# Patient Record
Sex: Female | Born: 1955 | Race: White | Hispanic: No | Marital: Married | Smoking: Never smoker
Health system: Southern US, Community
[De-identification: ages and names within clinical notes are randomized; demographics above are authoritative.]

## PROBLEM LIST (undated history)

## (undated) DIAGNOSIS — F329 Major depressive disorder, single episode, unspecified: Secondary | ICD-10-CM

## (undated) DIAGNOSIS — Z803 Family history of malignant neoplasm of breast: Secondary | ICD-10-CM

## (undated) DIAGNOSIS — Z8042 Family history of malignant neoplasm of prostate: Secondary | ICD-10-CM

## (undated) DIAGNOSIS — Z8051 Family history of malignant neoplasm of kidney: Secondary | ICD-10-CM

## (undated) DIAGNOSIS — F32A Depression, unspecified: Secondary | ICD-10-CM

## (undated) DIAGNOSIS — Z808 Family history of malignant neoplasm of other organs or systems: Secondary | ICD-10-CM

## (undated) HISTORY — DX: Family history of malignant neoplasm of other organs or systems: Z80.8

## (undated) HISTORY — DX: Family history of malignant neoplasm of breast: Z80.3

## (undated) HISTORY — PX: BLADDER SUSPENSION: SHX72

## (undated) HISTORY — PX: SHOULDER SURGERY: SHX246

## (undated) HISTORY — PX: TONSILLECTOMY: SUR1361

## (undated) HISTORY — PX: TUBAL LIGATION: SHX77

## (undated) HISTORY — PX: ABDOMINAL HYSTERECTOMY: SHX81

## (undated) HISTORY — DX: Family history of malignant neoplasm of prostate: Z80.42

## (undated) HISTORY — DX: Family history of malignant neoplasm of kidney: Z80.51

## (undated) HISTORY — DX: Depression, unspecified: F32.A

## (undated) HISTORY — DX: Major depressive disorder, single episode, unspecified: F32.9

---

## 1999-11-17 ENCOUNTER — Other Ambulatory Visit: Admission: RE | Admit: 1999-11-17 | Discharge: 1999-11-17 | Payer: Self-pay | Admitting: Obstetrics and Gynecology

## 2000-12-21 ENCOUNTER — Other Ambulatory Visit: Admission: RE | Admit: 2000-12-21 | Discharge: 2000-12-21 | Payer: Self-pay | Admitting: Obstetrics and Gynecology

## 2001-08-30 ENCOUNTER — Encounter (INDEPENDENT_AMBULATORY_CARE_PROVIDER_SITE_OTHER): Payer: Self-pay

## 2001-08-30 ENCOUNTER — Observation Stay (HOSPITAL_COMMUNITY): Admission: RE | Admit: 2001-08-30 | Discharge: 2001-08-31 | Payer: Self-pay | Admitting: Obstetrics and Gynecology

## 2007-07-13 ENCOUNTER — Encounter: Admission: RE | Admit: 2007-07-13 | Discharge: 2007-07-13 | Payer: Self-pay | Admitting: Family Medicine

## 2007-11-30 ENCOUNTER — Ambulatory Visit (HOSPITAL_BASED_OUTPATIENT_CLINIC_OR_DEPARTMENT_OTHER): Admission: RE | Admit: 2007-11-30 | Discharge: 2007-11-30 | Payer: Self-pay | Admitting: Urology

## 2010-11-09 NOTE — Op Note (Signed)
Judith Roth, Judith Roth             ACCOUNT NO.:  [id number]   MEDICAL RECORD NO.:  [id number]          PATIENT TYPE:  AMB   LOCATION:  [facility]                         FACILITY:  [facility]   PHYSICIAN:  Jamison Neighbor, M.D.  DATE OF BIRTH:  [birth year]   DATE OF PROCEDURE:  11/30/2007  DATE OF DISCHARGE:                               OPERATIVE REPORT   PREOPERATIVE DIAGNOSES:  1. Stress urinary incontinence.  2. Labial cyst.   PROCEDURE PERFORMED:  Drainage of labial cyst, cystoscopy, mid urethral  sling placement.   SURGEON:  Jamison Neighbor, M.D.   ASSISTANT:  Dr. Melina Schools.   ANESTHESIA:  General.   INDICATIONS FOR PROCEDURE:  Ms. Yaklin is a 55 year old female with  stress urinary incontinence.  She was evaluated with urodynamics and  found to be a candidate for mid urethral sling placed retropubically and  transvaginally.  She also had a small labial cyst which required  drainage.   DESCRIPTION OF PROCEDURE:  The patient was brought to the operating  room.  She was identified by her arm band.  Informed consent was  verified and preoperative time-out was performed.  After successful  induction of general anesthesia the patient was moved to the dorsal  lithotomy position.  The operative site was shaved, prepped and draped  in the usual fashion.  Perioperative antibiotics were administered.  Weighted speculum was placed within the vagina.  We saw a 1 cm diameter  cyst which was drained.  We then placed a Foley catheter and drained the  bladder.  Allis clamp was placed on the anterior vaginal wall and  lidocaine with epinephrine was injected submucosally for  hydrodissection.  We then made a vertical incision in the anterior  vaginal wall.  We dissected out laterally using shoelace scissors  ensuring an adequate thickness of vaginal flap.  Then dissection was  carried out toward the urethrovesical angle.  We could palpate the space  retropubically with a finger.  We then made  two stab incisions along the  pubis.  We then passed the curved needles along the posterior aspect of  the pubic symphysis into our vaginal incision.  We then performed  cystoscopy with 12 and 70 degree lenses and identified no bladder injury  and efflux of urine from the ureteral orifices.  We then passed the  sling.  It was tensioned with a right angle.  We did see adequate iris  effect on the urethra and also prompt straight flow of urine with Crede  maneuver.  We then irrigated.  The vaginal incision was closed with  running 2-0 Vicryl.  The suprapubic stab incisions were closed with  Dermabond.  The vagina was packed and the Foley catheter was plugged.  At this time the procedure was  terminated.  The patient tolerated the procedure well and there were no  complications.  Dr. Marcelyn Bruins was the attending primary care  physician and present and participated in all aspects of the procedure.   DISPOSITION:  Post anesthesia care unit.     ______________________________  Melina Schools, Dr      Molly Maduro  Danne [place], M.D.  Electronically Signed    JR/MEDQ  D:  11/30/2007  T:  11/30/2007  Job:  [id number]

## 2010-11-12 NOTE — Op Note (Signed)
[facility]  Patient:    Judith Roth, Judith Roth Visit Number: [id number] MRN: [id number]          Service Type: DSU Location: 9300 [id number] Attending Physician:  Lenoard Aden Dictated by:   Lenoard Aden, M.D. Proc. Date: 08/30/01 Admit Date:  08/30/2001                             Operative Report  PREOPERATIVE DIAGNOSES:       Menometrorrhagia with pelvic pain and symptomatic uterine fibroids.  POSTOPERATIVE DIAGNOSES:      Menometrorrhagia with pelvic pain and symptomatic uterine fibroids, enterocele.  PROCEDURE:                    Laparoscopically assisted vaginal hysterectomy, bilateral salpingo-oophorectomy, uterine morcellation, McCall culdoplasty.  SURGEON:                      Lenoard Aden, M.D.  ASSISTANT:                    Sung Amabile. Roslyn Smiling, M.D.  ESTIMATED BLOOD LOSS:         200 cc.  COMPLICATIONS:                None.  DRAINS:                       Foley and vaginal pack.  DISPOSITION:                  Patient to recovery room in good condition.  COUNTS:                       Correct.  Pictures taken.  OPERATIVE NOTE:               After being apprised of the risks of anesthesia, infection, bleeding, injury to abdominal organs, need for repair patient is brought to the operating room where she is administered general anesthesia without complications.  Prepped and draped in usual sterile fashion.  Foley catheter placed.  Infraumbilical made with a scalpel.  Hulka tenaculum placed per vagina.  Dilute Marcaine solution placed in the abdominal solution.  Hulka tenaculum placed prior to abdominal incision.  At this time Veress needle placed.  Opening pressure -2 noted.  Patient pressure set at 25.  CO2 5 L insufflated without difficulty.  Veress needle placed atraumatically.  Hanging drop test is consistent with intraperitoneal entry.  After the 5 L of CO2 had been insufflated without difficulty the Veress needle was  removed, then 10 mm trocar entered.  Visualization reveals atraumatic trocar entry, normal liver/gallbladder bed, area of right mid quadrant adhesions to the right lateral side wall.  Appendix not visualized.  Normal right and left ovary. Previously surgically divided tubes and bulky uterus with multiple fibroids. At this time the cul-de-sac anterior and posterior appear clear.  Both ureters are visualized easily and 5 mm trocar sites are placed bilaterally in the mid clavicular line midway between the suprapubic area and the umbilicus.  At this time tripolar is used to divide the infundibulopelvic ligament after identifying the ureter and this is done taking progressive bites down to the round ligament and then down to the level of the uterine vessels achieving good hemostasis in the process.  Bladder flap is developed sharply after identifying the same landmarks  on the right.  It is noted that there is bleeding from the left lower quadrant trocar site.  These trocar is then removed.  Prolene 0 suture is placed and cautery is used to cauterize inside the incision internally.  Good hemostasis achieved.  Sutures left.  Suprapubic incision is then made and 5 mm trocar placed ______.  Right pedicles are taken using the tripolar cautery, infundibulopelvic ligament after identification of the ureter and then down to the round ligament and to the level of the uterine vessels after developing the bladder flap sharply.  At this time attention is turned to the vaginal portion of the procedure whereby a weighted speculum is placed.  Dilute vasopressin solution infiltrated at the cervicovaginal junction and this junction is scored using electrocautery.  The posterior cul-de-sac entry is made sharply and atraumatically.  A long weighted retractor is placed.  Uterosacral ligaments are bilaterally clamped, suture ligated, transfixed to the vaginal cuff, and held.  LigaSure is used to secure the uterine  vessels bilaterally and anterior cul-de-sac entry is made. Progressive bites along the lateral cardinal ligament complexes and broad ligament complexes are made using the LigaSure dividing these pedicles after appropriate hemostasis is noted.  Specimen is then morcellated with amputation of the cervix and morcellation of the uterus to the liver in two specimens, the uterus, tubes, ovaries, and cervix.  Enterocele is then identified and closed using a 0 Vicryl for internal McCall culdoplasty suture.  Vaginal cuff is then closed side to side from anterior to posterior using 0 Vicryl popoffs. Packing is placed.  Clear urine is noted.  Attention is turned to the abdominal portion of the procedure whereby all pedicles appear hemostatic. Irrigation is accomplished.  Patient pressure is released and found to be hemostatic.  Prolene 0 suture removed from left lower quadrant site and after releasing the pneumoperitoneum no bleeding is noted from any of the operative pedicles nor from this umbilical site.  Dermabond is placed on the three subumbilical skin areas with 0 Vicryl and 4-0 Vicryl placed in the umbilicus. Patient is awakened and transferred to recovery in good condition. Dictated by:   Lenoard Aden, M.D. Attending Physician:  Lenoard Aden DD:  08/30/01 TD:  08/31/01 Job: [id number]

## 2010-11-12 NOTE — H&P (Signed)
[facility]  Patient:    Judith Roth, Judith Roth Visit Number: [id number] MRN: [id number]          Service Type: DSU Location: 9300 [id number] Attending Physician:  Lenoard Aden Dictated by:   Lenoard Aden, M.D. Admit Date:  08/30/2001                           History and Physical  CHIEF COMPLAINT:              Menometrorrhagia with symptomatic uterine fibroids.  HISTORY OF PRESENT ILLNESS:   A 55 year old white female G2, P2 with a history of secondary anemia, dysmenorrhea, menorrhagia status post normal endometrial biopsy January 2003 for definitive therapy.  MEDICATIONS:                  Prozac.  PAST MEDICAL HISTORY:         Hypertension.  FAMILY HISTORY:               Hypertension, breast cancer, kidney and prostate cancer, diabetes, heart disease.  PAST SURGICAL HISTORY:        Tonsillectomy and eye surgery.  SOCIAL HISTORY:               She is status post divorce and currently in monogamous relationship.  PAST GYNECOLOGIC HISTORY:     Two previous vaginal deliveries.  PHYSICAL EXAMINATION  GENERAL:                      She is a well-developed, well-nourished white female in no apparent distress.  HEENT:                        normal.  LUNGS:                        Clear.  HEART:                        Regular rate and rhythm.  ABDOMEN:                      Soft, scaphoid, and nontender.  PELVIC:                       Enlarged anteflexed uterus, bulky.  No adnexal masses appreciated.  Grade II cystocele and no rectocele.  IMPRESSION:                   1. Symptomatic pelvic relaxation with no                                  evidence of stress urinary incontinence                                  status post urology consultation.  Dr.                                  Wanda Plump recommends no placement of  pubovaginal sling at this time.                               2. Menometrorrhagia  with inability to control                                  hormonally.  PLAN:                         Proceed with definitive therapy in the form of LAVH, BSO, anterior colporrhaphy, possible TAH/BSO.  Risks of anesthesia, infection, bleeding, injury to abdominal organs with need for repair is discussed.  Delayed versus immediate complications to include bowel and bladder injury are noted.  Patient acknowledges and desires to proceed. Dictated by:   Lenoard Aden, M.D. Attending Physician:  Lenoard Aden DD:  08/30/01 TD:  08/30/01 Job: [id number]

## 2010-11-12 NOTE — Discharge Summary (Signed)
[facility]  Patient:    Judith Roth, Judith Roth Visit Number: [id number] MRN: [id number]          Service Type: DSU Location: 9300 [id number] Attending Physician:  Lenoard Aden Dictated by:   Lenoard Aden, M.D. Admit Date:  08/30/2001 Discharge Date: 08/31/2001                             Discharge Summary  HOSPITAL COURSE:              The patient was underwent uncomplicated LAVH-BSO, McCall culdoplasty August 30, 2001.  Tolerated regular diet well. Postoperative hemoglobin 9.4.  Discharged to home day 1.  Tylox given. Follow-up at the office one week. Dictated by:   Lenoard Aden, M.D. Attending Physician:  Lenoard Aden DD:  09/14/01 TD:  09/17/01 Job: [id number]

## 2011-02-02 ENCOUNTER — Other Ambulatory Visit: Payer: Self-pay | Admitting: Dermatology

## 2011-03-24 LAB — POCT HEMOGLOBIN-HEMACUE
Hemoglobin: 14.3
Operator id: 133231

## 2012-02-01 ENCOUNTER — Other Ambulatory Visit: Payer: Self-pay | Admitting: Dermatology

## 2012-02-28 ENCOUNTER — Ambulatory Visit (INDEPENDENT_AMBULATORY_CARE_PROVIDER_SITE_OTHER): Payer: BC Managed Care – PPO | Admitting: Family Medicine

## 2012-02-28 VITALS — BP 94/60 | HR 63 | Temp 97.9°F | Resp 14 | Ht 66.0 in | Wt 172.0 lb

## 2012-02-28 DIAGNOSIS — N390 Urinary tract infection, site not specified: Secondary | ICD-10-CM

## 2012-02-28 DIAGNOSIS — R350 Frequency of micturition: Secondary | ICD-10-CM

## 2012-02-28 DIAGNOSIS — R319 Hematuria, unspecified: Secondary | ICD-10-CM

## 2012-02-28 LAB — POCT URINALYSIS DIPSTICK
Bilirubin, UA: NEGATIVE
Glucose, UA: NEGATIVE
Ketones, UA: NEGATIVE
Nitrite, UA: POSITIVE
Spec Grav, UA: 1.01
Urobilinogen, UA: 0.2
pH, UA: 7

## 2012-02-28 LAB — POCT UA - MICROSCOPIC ONLY
Casts, Ur, LPF, POC: NEGATIVE
Crystals, Ur, HPF, POC: NEGATIVE
Yeast, UA: NEGATIVE

## 2012-02-28 MED ORDER — CIPROFLOXACIN HCL 500 MG PO TABS: 500.0000 mg | ORAL_TABLET | Freq: Two times a day (BID) | ORAL | Status: AC

## 2012-02-28 NOTE — Patient Instructions (Addendum)
1. Frequency of urination  POCT urinalysis dipstick, POCT UA - Microscopic Only, ciprofloxacin (CIPRO) 500 MG tablet  2. UTI (lower urinary tract infection)  ciprofloxacin (CIPRO) 500 MG tablet   Urinary Tract Infection Infections of the urinary tract can start in several places. A bladder infection (cystitis), a kidney infection (pyelonephritis), and a prostate infection (prostatitis) are different types of urinary tract infections (UTIs). They usually get better if treated with medicines (antibiotics) that kill germs. Take all the medicine until it is gone. You or your child may feel better in a few days, but TAKE ALL MEDICINE or the infection may not respond and may become more difficult to treat. HOME CARE INSTRUCTIONS   Drink enough water and fluids to keep the urine clear or pale yellow. Cranberry juice is especially recommended, in addition to large amounts of water.   Avoid caffeine, tea, and carbonated beverages. They tend to irritate the bladder.   Alcohol may irritate the prostate.   Only take over-the-counter or prescription medicines for pain, discomfort, or fever as directed by your caregiver.  To prevent further infections:  Empty the bladder often. Avoid holding urine for long periods of time.   After a bowel movement, women should cleanse from front to back. Use each tissue only once.   Empty the bladder before and after sexual intercourse.  FINDING OUT THE RESULTS OF YOUR TEST Not all test results are available during your visit. If your or your child's test results are not back during the visit, make an appointment with your caregiver to find out the results. Do not assume everything is normal if you have not heard from your caregiver or the medical facility. It is important for you to follow up on all test results. SEEK MEDICAL CARE IF:   There is back pain.   Your baby is older than 3 months with a rectal temperature of 100.5 F (38.1 C) or higher for more than 1 day.    Your or your child's problems (symptoms) are no better in 3 days. Return sooner if you or your child is getting worse.  SEEK IMMEDIATE MEDICAL CARE IF:   There is severe back pain or lower abdominal pain.   You or your child develops chills.   You have a fever.   Your baby is older than 3 months with a rectal temperature of 102 F (38.9 C) or higher.   Your baby is 69 months old or younger with a rectal temperature of 100.4 F (38 C) or higher.   There is nausea or vomiting.   There is continued burning or discomfort with urination.  MAKE SURE YOU:   Understand these instructions.   Will watch your condition.   Will get help right away if you are not doing well or get worse.  Document Released: 03/23/2005 Document Revised: 06/02/2011 Document Reviewed: 10/26/2006 [facility] Patient Information 2012 [place], [place].

## 2012-02-28 NOTE — Progress Notes (Signed)
Subjective:    Patient ID: Judith Roth, female    DOB: 01-May-1956, 56 y.o.   MRN: [id number]  HPIThis 56 y.o. female presents for evaluation of bladder infection.  Onset one day ago.  +burning with urination; +frequency.  Trying to drink increased amount of water.  +diarrhea with UTI usually; +HA.  No fever but +chills/sweats.  +nausea; no vomiting.  No hematuria.  Taking AZO.  No flank pain.  Nocturia x 1; nocturia x 0.  No vaginal discharge.  No new sexual partners in past six months.  Suffers with 3 UTIs per year.    PCP:  [facility] PMH: 1. Depression Psurg: 1. Hysterectomy DUB/fibroids  2.  Tonsillectomy 3.  Shoulder Surgery  4. BTL  5.  Bladder Sling by Logan Bores 2008 Medications:  1.  Prozac  2. Calcium, Iron, MVI Social: married.   Review of Systems  Constitutional: Positive for chills. Negative for fever, diaphoresis and fatigue.  Gastrointestinal: Positive for nausea and diarrhea. Negative for vomiting and abdominal pain.  Genitourinary: Positive for dysuria, urgency and frequency. Negative for hematuria, flank pain, vaginal discharge, genital sores, vaginal pain and pelvic pain.        Past Medical History  Diagnosis Date  . Depression     Past Surgical History  Procedure Date  . Abdominal hysterectomy     DUB/fibroids  . Tubal ligation   . Bladder suspension     [place]  . Shoulder surgery   . Tonsillectomy     Prior to Admission medications   Medication Sig Start Date End Date Taking? Authorizing Provider  calcium carbonate 200 MG capsule Take 250 mg by mouth 2 (two) times daily with a meal.   Yes Historical Provider, MD  Estradiol Acetate (FEMRING) 0.1 MG/24HR RING Place 1 each vaginally.   Yes Historical Provider, MD  FLUoxetine (PROZAC) 20 MG capsule Take 20 mg by mouth 2 (two) times daily.   Yes Historical Provider, MD  ciprofloxacin (CIPRO) 500 MG tablet Take 1 tablet (500 mg total) by mouth 2 (two) times daily. 02/28/12 03/09/12  Ethelda Chick, MD     No Known Allergies  History   Social History  . Marital Status: Married    Spouse Name: N/A    Number of Children: N/A  . Years of Education: N/A   Occupational History  . Not on file.   Social History Main Topics  . Smoking status: Never Smoker   . Smokeless tobacco: Not on file  . Alcohol Use: Not on file  . Drug Use: Not on file  . Sexually Active: Yes   Other Topics Concern  . Not on file   Social History Narrative  . No narrative on file    No family history on file.  Objective:   Physical Exam  Nursing note and vitals reviewed. Constitutional: She is oriented to person, place, and time. She appears well-developed and well-nourished. No distress.  Eyes: Conjunctivae and EOM are normal. Pupils are equal, round, and reactive to light.  Cardiovascular: Normal rate, regular rhythm and normal heart sounds.   Pulmonary/Chest: Effort normal and breath sounds normal.  Abdominal: Soft. Bowel sounds are normal. She exhibits no distension and no mass. There is tenderness in the suprapubic area. There is no rebound, no guarding and no CVA tenderness.  Neurological: She is alert and oriented to person, place, and time.  Skin: Skin is warm and dry.  Psychiatric: She has a normal mood and affect. Her behavior is normal.  Judgment and thought content normal.       Results for orders placed in visit on 02/28/12  POCT URINALYSIS DIPSTICK      Component Value Range   Color, UA yellow     Clarity, UA cloudy     Glucose, UA neg     Bilirubin, UA neg     Ketones, UA neg     Spec Grav, UA 1.010     Blood, UA mod     pH, UA 7.0     Protein, UA trace     Urobilinogen, UA 0.2     Nitrite, UA positive     Leukocytes, UA large (3+)    POCT UA - MICROSCOPIC ONLY      Component Value Range   WBC, Ur, HPF, POC TNTC     RBC, urine, microscopic 4-8     Bacteria, U Microscopic 1+     Mucus, UA trace     Epithelial cells, urine per micros 2-4     Crystals, Ur, HPF, POC neg      Casts, Ur, LPF, POC neg     Yeast, UA neg        Assessment & Plan:   1. Frequency of urination  POCT urinalysis dipstick, POCT UA - Microscopic Only, ciprofloxacin (CIPRO) 500 MG tablet  2. UTI (lower urinary tract infection)  ciprofloxacin (CIPRO) 500 MG tablet  3.  Hematuria  1.  UTI:  New.  Rx for Cipro provided; continue AZO for next 48 hours.  RTC for fever, vomiting, flank pain.  Obtain urine culture.  Increase fluid intake. 2. Hematuria: New.  Likely secondary to UTI; RTC 2 weeks for repeat u/a.

## 2012-02-28 NOTE — Progress Notes (Signed)
Reviewed and agree.

## 2012-02-29 LAB — URINE CULTURE

## 2013-01-30 ENCOUNTER — Other Ambulatory Visit: Payer: Self-pay | Admitting: Dermatology

## 2013-03-12 ENCOUNTER — Ambulatory Visit (INDEPENDENT_AMBULATORY_CARE_PROVIDER_SITE_OTHER): Payer: PRIVATE HEALTH INSURANCE | Admitting: Physician Assistant

## 2013-03-12 VITALS — BP 114/68 | HR 84 | Temp 98.2°F | Resp 18 | Ht 65.0 in | Wt 182.6 lb

## 2013-03-12 DIAGNOSIS — R350 Frequency of micturition: Secondary | ICD-10-CM

## 2013-03-12 LAB — POCT URINALYSIS DIPSTICK
Bilirubin, UA: NEGATIVE
Glucose, UA: NEGATIVE
Ketones, UA: NEGATIVE
Nitrite, UA: NEGATIVE
Protein, UA: NEGATIVE
Spec Grav, UA: 1.01
Urobilinogen, UA: 0.2
pH, UA: 6

## 2013-03-12 LAB — POCT UA - MICROSCOPIC ONLY
Casts, Ur, LPF, POC: NEGATIVE
Crystals, Ur, HPF, POC: NEGATIVE
Mucus, UA: NEGATIVE
Yeast, UA: NEGATIVE

## 2013-03-12 MED ORDER — CIPROFLOXACIN HCL 500 MG PO TABS: 500.0000 mg | ORAL_TABLET | Freq: Two times a day (BID) | ORAL | Status: DC

## 2013-03-12 NOTE — Patient Instructions (Signed)
Get plenty of rest and drink at least 64 ounces of water daily. 

## 2013-03-12 NOTE — Progress Notes (Signed)
  Subjective:    Patient ID: Judith Roth, female    DOB: 05-03-1956, 57 y.o.   MRN: [id number]  HPI This 57 y.o. female presents for evaluation of urinary urgency, frequency and burning, nausea, pelvic pain and diarrhea.  These are the symptoms she typically experiences with UTI. Had a bladder infection about 3 weeks ago, and was seen by her PCP for treatment.  Symptoms resolved, then recurred several days ago.  Has a history of recurrent bladder infections, which became less frequent after bladder sling placed. Cranberry pills have helped some with the current episode. Monogamous sex with her husband.  No vaginal symptoms.  Medications, allergies, past medical history, surgical history, family history, social history and problem list reviewed.  Review of Systems As above. No fever, chills, no dizziness.    Objective:   Physical Exam  Constitutional: She is oriented to person, place, and time. Vital signs are normal. She appears well-developed and well-nourished. No distress.  HENT:  Head: Normocephalic and atraumatic.  Cardiovascular: Normal rate, regular rhythm and normal heart sounds.   Pulmonary/Chest: Effort normal and breath sounds normal.  Abdominal: Soft. Normal appearance and bowel sounds are normal. She exhibits no distension and no mass. There is no hepatosplenomegaly. There is no tenderness. There is no rigidity, no rebound, no guarding, no CVA tenderness, no tenderness at McBurney's point and negative Murphy's sign. No hernia.  Musculoskeletal: Normal range of motion.       Lumbar back: Normal.  Neurological: She is alert and oriented to person, place, and time.  Skin: Skin is warm and dry. No rash noted. She is not diaphoretic. No pallor.  Psychiatric: She has a normal mood and affect. Her speech is normal and behavior is normal. Judgment normal.    Results for orders placed in visit on 03/12/13  POCT UA - MICROSCOPIC ONLY      Result Value Range   WBC, Ur, HPF, POC  tntc     RBC, urine, microscopic 2-4     Bacteria, U Microscopic trace     Mucus, UA neg     Epithelial cells, urine per micros 0-3     Crystals, Ur, HPF, POC neg     Casts, Ur, LPF, POC neg     Yeast, UA neg    POCT URINALYSIS DIPSTICK      Result Value Range   Color, UA yellow     Clarity, UA cloudy     Glucose, UA neg     Bilirubin, UA neg     Ketones, UA neg     Spec Grav, UA 1.010     Blood, UA small     pH, UA 6.0     Protein, UA neg     Urobilinogen, UA 0.2     Nitrite, UA neg     Leukocytes, UA moderate (2+)           Assessment & Plan:  Frequency of urination - Plan: POCT UA - Microscopic Only, POCT urinalysis dipstick, Urine culture, ciprofloxacin (CIPRO) 500 MG tablet  Supportive care, anticipatory guidance provided; RTC if symptoms worsen/persist.  Fernande Bras, PA-C Physician Assistant-Certified [facility]

## 2013-03-14 LAB — URINE CULTURE
Colony Count: NO GROWTH
Organism ID, Bacteria: NO GROWTH

## 2013-06-28 ENCOUNTER — Ambulatory Visit: Payer: PRIVATE HEALTH INSURANCE

## 2013-06-29 ENCOUNTER — Ambulatory Visit (INDEPENDENT_AMBULATORY_CARE_PROVIDER_SITE_OTHER): Payer: PRIVATE HEALTH INSURANCE | Admitting: Internal Medicine

## 2013-06-29 VITALS — BP 118/62 | HR 63 | Temp 98.1°F | Resp 18 | Ht 65.5 in | Wt 185.4 lb

## 2013-06-29 DIAGNOSIS — R300 Dysuria: Secondary | ICD-10-CM

## 2013-06-29 DIAGNOSIS — R350 Frequency of micturition: Secondary | ICD-10-CM

## 2013-06-29 DIAGNOSIS — N390 Urinary tract infection, site not specified: Secondary | ICD-10-CM

## 2013-06-29 LAB — POCT URINALYSIS DIPSTICK
Bilirubin, UA: NEGATIVE
Glucose, UA: NEGATIVE
Ketones, UA: NEGATIVE
Nitrite, UA: POSITIVE
Protein, UA: NEGATIVE
Spec Grav, UA: <=1.005
Urobilinogen, UA: 0.2
pH, UA: 6.5

## 2013-06-29 LAB — POCT UA - MICROSCOPIC ONLY
Casts, Ur, LPF, POC: NEGATIVE
Crystals, Ur, HPF, POC: NEGATIVE
Mucus, UA: NEGATIVE
Yeast, UA: POSITIVE

## 2013-06-29 MED ORDER — CIPROFLOXACIN HCL 500 MG PO TABS: 500.0000 mg | ORAL_TABLET | Freq: Two times a day (BID) | ORAL | Status: AC

## 2013-06-29 NOTE — Progress Notes (Addendum)
Subjective:    Patient ID: Judith Roth, female    DOB: 02-Mar-1956, 58 y.o.   MRN: [id number]  HPI This chart was scribed for Ellamae Siaobert Doolittle, MD by Andrew Auaven Small, Scribe. This patient was seen in room 11 and the patient's care was started at 9:29 AM.  HPI Comments: Judith NoeCynthia J Gatchell is a 58 y.o. female who presents to the [facility] complaining of intermittent dysuria onset for one month with associated increase urinary frequency. Pt states that when her bladder is full it is very painful. She also states that she has experienced painful intercourse. Pain with deep thrusting just in the last 3 weeks .Pt states that last night the pain wants so bad that her husband has to go to the store for Pyridium which was helpful.  Pt also reports HA,? fever, back pain yesterday. She denies rashes or itchiness around vaginal area and there is no history of herpetic infection.  She had a normal gynecological examination and Pap smear within the last 3 months  Pt states she had a bladder sling surgery by Dr. Logan BoresEvans 5 years ago with good results. No history of nephrolithiasis  Pt denies history of allergies. This represents the third or fourth time she has been treated this year. She has had symptoms for about 3 or 4 weeks at this time but she tries to avoid coming in as the symptoms come back so often. The last 2 infections or 3 infections the culture did not grow bacteria-she does seem to respond to antibiotics   Past Medical History  Diagnosis Date  . Depression    Past Surgical History  Procedure Laterality Date  . Abdominal hysterectomy      DUB/fibroids  . Tubal ligation    . Bladder suspension      Evans  . Shoulder surgery    . Tonsillectomy     Family History  Problem Relation Age of Onset  . Cancer Mother   . Liver disease Mother   . Cancer Father   . Diabetes Brother   . Cancer Son    History   Social History  . Marital Status: Married    Spouse Name:  N/A    Number of Children: N/A  . Years of Education: N/A   Occupational History  . Not on file.   Social History Main Topics  . Smoking status: Never Smoker   . Smokeless tobacco: Not on file  . Alcohol Use: No  . Drug Use: No  . Sexual Activity: Yes    Birth Control/ Protection: Post-menopausal   Other Topics Concern  . Not on file   Social History Narrative  . No narrative on file   No Known Allergies There are no active problems to display for this patient.  No orders of the defined types were placed in this encounter.   BP 118/62  Pulse 63  Temp(Src) 98.1 F (36.7 C) (Oral)  Resp 18  Ht 5' 5.5" (1.664 m)  Wt 185 lb 6.4 oz (84.097 kg)  BMI 30.37 kg/m2  SpO2 99%   Review of Systems Noncontributory    Objective:   Physical Exam BP 118/62  Pulse 63  Temp(Src) 98.1 F (36.7 C) (Oral)  Resp 18  Ht 5' 5.5" (1.664 m)  Wt 185 lb 6.4 oz (84.097 kg)  BMI 30.37 kg/m2  SpO2 99% No acute distress Abdomen benign No CVA tenderness to percussion    Results for orders placed in visit on  06/29/13  POCT URINALYSIS DIPSTICK      Result Value Range   Color, UA dk yellow     Clarity, UA cloudy     Glucose, UA neg     Bilirubin, UA neg     Ketones, UA neg     Spec Grav, UA <=1.005     Blood, UA small     pH, UA 6.5     Protein, UA neg     Urobilinogen, UA 0.2     Nitrite, UA pos     Leukocytes, UA small (1+)    POCT UA - MICROSCOPIC ONLY      Result Value Range   WBC, Ur, HPF, POC 3-11     RBC, urine, microscopic 1-5     Bacteria, U Microscopic 3+     Mucus, UA neg     Epithelial cells, urine per micros 0-1     Crystals, Ur, HPF, POC neg     Casts, Ur, LPF, POC neg     Yeast, UA pos       Assessment & Plan:  I have completed the patient encounter in its entirety as documented by the scribe, with editing by me where necessary. Robert P. Merla Riches, M.D.  #1 Recurrent UTI--? Underlying mechanical problem/ICS/Viral? recult eval Dr Logan Bores Start Cipro

## 2013-07-01 LAB — URINE CULTURE: Colony Count: >=100000

## 2013-10-31 ENCOUNTER — Telehealth: Payer: Self-pay

## 2013-10-31 NOTE — Telephone Encounter (Signed)
Pt is needing to get a copy billing statement of jan 3 visit with dr Sharrell Kudoolitte for tax purposes  Best number [phone]

## 2014-04-30 ENCOUNTER — Other Ambulatory Visit: Payer: Self-pay | Admitting: Obstetrics and Gynecology

## 2014-05-02 LAB — CYTOLOGY - PAP

## 2014-09-03 ENCOUNTER — Other Ambulatory Visit: Payer: Self-pay | Admitting: Dermatology

## 2014-09-16 ENCOUNTER — Ambulatory Visit (INDEPENDENT_AMBULATORY_CARE_PROVIDER_SITE_OTHER): Payer: BLUE CROSS/BLUE SHIELD | Admitting: Sports Medicine

## 2014-09-16 VITALS — BP 132/82 | HR 72 | Temp 98.0°F | Resp 17 | Ht 66.5 in | Wt 187.0 lb

## 2014-09-16 DIAGNOSIS — S134XXA Sprain of ligaments of cervical spine, initial encounter: Secondary | ICD-10-CM

## 2014-09-16 MED ORDER — CYCLOBENZAPRINE HCL 10 MG PO TABS: 10.0000 mg | ORAL_TABLET | Freq: Three times a day (TID) | ORAL | Status: AC | PRN

## 2014-09-16 MED ORDER — DICLOFENAC SODIUM 75 MG PO TBEC: 75.0000 mg | DELAYED_RELEASE_TABLET | Freq: Two times a day (BID) | ORAL | Status: AC

## 2014-09-16 NOTE — Patient Instructions (Signed)
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
## 2015-06-16 ENCOUNTER — Other Ambulatory Visit: Payer: Self-pay | Admitting: Obstetrics and Gynecology

## 2015-06-16 DIAGNOSIS — N644 Mastodynia: Secondary | ICD-10-CM

## 2015-06-19 ENCOUNTER — Ambulatory Visit
Admission: RE | Admit: 2015-06-19 | Discharge: 2015-06-19 | Disposition: A | Discharge status: Home / Self Care | Payer: BLUE CROSS/BLUE SHIELD | Source: Ambulatory Visit | Attending: Obstetrics and Gynecology | Admitting: Obstetrics and Gynecology

## 2015-06-19 ENCOUNTER — Other Ambulatory Visit: Payer: Self-pay | Admitting: Obstetrics and Gynecology

## 2015-06-19 DIAGNOSIS — N644 Mastodynia: Secondary | ICD-10-CM

## 2015-06-25 ENCOUNTER — Other Ambulatory Visit: Payer: BLUE CROSS/BLUE SHIELD

## 2015-06-25 ENCOUNTER — Ambulatory Visit
Admission: RE | Admit: 2015-06-25 | Discharge: 2015-06-25 | Disposition: A | Discharge status: Home / Self Care | Payer: BLUE CROSS/BLUE SHIELD | Source: Ambulatory Visit | Attending: Obstetrics and Gynecology | Admitting: Obstetrics and Gynecology

## 2015-06-25 ENCOUNTER — Inpatient Hospital Stay: Admission: RE | Admit: 2015-06-25 | Payer: BLUE CROSS/BLUE SHIELD | Source: Ambulatory Visit

## 2015-12-17 ENCOUNTER — Other Ambulatory Visit: Payer: Self-pay | Admitting: Gastroenterology

## 2015-12-17 DIAGNOSIS — K529 Noninfective gastroenteritis and colitis, unspecified: Secondary | ICD-10-CM | POA: Diagnosis not present

## 2015-12-17 DIAGNOSIS — K6389 Other specified diseases of intestine: Secondary | ICD-10-CM | POA: Diagnosis not present

## 2016-03-11 DIAGNOSIS — R197 Diarrhea, unspecified: Secondary | ICD-10-CM | POA: Diagnosis not present

## 2016-07-06 ENCOUNTER — Other Ambulatory Visit: Payer: Self-pay | Admitting: Family Medicine

## 2016-07-06 DIAGNOSIS — Z1231 Encounter for screening mammogram for malignant neoplasm of breast: Secondary | ICD-10-CM

## 2016-08-02 ENCOUNTER — Ambulatory Visit
Admission: RE | Admit: 2016-08-02 | Discharge: 2016-08-02 | Disposition: A | Discharge status: Home / Self Care | Payer: BLUE CROSS/BLUE SHIELD | Source: Ambulatory Visit | Attending: Family Medicine | Admitting: Family Medicine

## 2016-08-02 DIAGNOSIS — Z1231 Encounter for screening mammogram for malignant neoplasm of breast: Secondary | ICD-10-CM

## 2016-08-24 DIAGNOSIS — F339 Major depressive disorder, recurrent, unspecified: Secondary | ICD-10-CM | POA: Diagnosis not present

## 2016-08-24 DIAGNOSIS — E785 Hyperlipidemia, unspecified: Secondary | ICD-10-CM | POA: Diagnosis not present

## 2016-08-24 DIAGNOSIS — E559 Vitamin D deficiency, unspecified: Secondary | ICD-10-CM | POA: Diagnosis not present

## 2016-08-24 DIAGNOSIS — Z0000 Encounter for general adult medical examination without abnormal findings: Secondary | ICD-10-CM | POA: Diagnosis not present

## 2016-08-24 DIAGNOSIS — Z6831 Body mass index (BMI) 31.0-31.9, adult: Secondary | ICD-10-CM | POA: Diagnosis not present

## 2016-08-24 DIAGNOSIS — N952 Postmenopausal atrophic vaginitis: Secondary | ICD-10-CM | POA: Diagnosis not present

## 2016-08-24 DIAGNOSIS — F419 Anxiety disorder, unspecified: Secondary | ICD-10-CM | POA: Diagnosis not present

## 2016-08-24 DIAGNOSIS — Z23 Encounter for immunization: Secondary | ICD-10-CM | POA: Diagnosis not present

## 2016-08-24 DIAGNOSIS — E669 Obesity, unspecified: Secondary | ICD-10-CM | POA: Diagnosis not present

## 2016-09-14 DIAGNOSIS — M8588 Other specified disorders of bone density and structure, other site: Secondary | ICD-10-CM | POA: Diagnosis not present

## 2017-01-13 DIAGNOSIS — D2272 Melanocytic nevi of left lower limb, including hip: Secondary | ICD-10-CM | POA: Diagnosis not present

## 2017-01-13 DIAGNOSIS — D225 Melanocytic nevi of trunk: Secondary | ICD-10-CM | POA: Diagnosis not present

## 2017-01-13 DIAGNOSIS — D2262 Melanocytic nevi of left upper limb, including shoulder: Secondary | ICD-10-CM | POA: Diagnosis not present

## 2017-01-13 DIAGNOSIS — D2261 Melanocytic nevi of right upper limb, including shoulder: Secondary | ICD-10-CM | POA: Diagnosis not present

## 2017-08-02 ENCOUNTER — Other Ambulatory Visit: Payer: Self-pay | Admitting: Family Medicine

## 2017-08-02 DIAGNOSIS — Z1231 Encounter for screening mammogram for malignant neoplasm of breast: Secondary | ICD-10-CM

## 2017-08-16 DIAGNOSIS — L814 Other melanin hyperpigmentation: Secondary | ICD-10-CM | POA: Diagnosis not present

## 2017-08-16 DIAGNOSIS — B078 Other viral warts: Secondary | ICD-10-CM | POA: Diagnosis not present

## 2017-08-16 DIAGNOSIS — L7211 Pilar cyst: Secondary | ICD-10-CM | POA: Diagnosis not present

## 2017-08-22 ENCOUNTER — Ambulatory Visit
Admission: RE | Admit: 2017-08-22 | Discharge: 2017-08-22 | Disposition: A | Discharge status: Home / Self Care | Payer: BLUE CROSS/BLUE SHIELD | Source: Ambulatory Visit | Attending: Family Medicine | Admitting: Family Medicine

## 2017-08-22 DIAGNOSIS — Z1231 Encounter for screening mammogram for malignant neoplasm of breast: Secondary | ICD-10-CM

## 2017-09-05 DIAGNOSIS — E559 Vitamin D deficiency, unspecified: Secondary | ICD-10-CM | POA: Diagnosis not present

## 2017-09-05 DIAGNOSIS — Z0000 Encounter for general adult medical examination without abnormal findings: Secondary | ICD-10-CM | POA: Diagnosis not present

## 2017-09-05 DIAGNOSIS — E785 Hyperlipidemia, unspecified: Secondary | ICD-10-CM | POA: Diagnosis not present

## 2017-09-07 DIAGNOSIS — L720 Epidermal cyst: Secondary | ICD-10-CM | POA: Diagnosis not present

## 2017-09-07 DIAGNOSIS — L7211 Pilar cyst: Secondary | ICD-10-CM | POA: Diagnosis not present

## 2017-10-12 DIAGNOSIS — N3001 Acute cystitis with hematuria: Secondary | ICD-10-CM | POA: Diagnosis not present

## 2017-10-12 DIAGNOSIS — R300 Dysuria: Secondary | ICD-10-CM | POA: Diagnosis not present

## 2017-12-02 DIAGNOSIS — B001 Herpesviral vesicular dermatitis: Secondary | ICD-10-CM | POA: Diagnosis not present

## 2018-02-22 DIAGNOSIS — D2239 Melanocytic nevi of other parts of face: Secondary | ICD-10-CM | POA: Diagnosis not present

## 2018-02-22 DIAGNOSIS — D2261 Melanocytic nevi of right upper limb, including shoulder: Secondary | ICD-10-CM | POA: Diagnosis not present

## 2018-02-22 DIAGNOSIS — D2262 Melanocytic nevi of left upper limb, including shoulder: Secondary | ICD-10-CM | POA: Diagnosis not present

## 2018-02-22 DIAGNOSIS — L905 Scar conditions and fibrosis of skin: Secondary | ICD-10-CM | POA: Diagnosis not present

## 2018-06-30 DIAGNOSIS — H6691 Otitis media, unspecified, right ear: Secondary | ICD-10-CM | POA: Diagnosis not present

## 2018-06-30 DIAGNOSIS — J180 Bronchopneumonia, unspecified organism: Secondary | ICD-10-CM | POA: Diagnosis not present

## 2018-07-18 DIAGNOSIS — J189 Pneumonia, unspecified organism: Secondary | ICD-10-CM | POA: Diagnosis not present

## 2018-07-20 ENCOUNTER — Other Ambulatory Visit: Payer: Self-pay | Admitting: Emergency Medicine

## 2018-08-01 DIAGNOSIS — J189 Pneumonia, unspecified organism: Secondary | ICD-10-CM | POA: Diagnosis not present

## 2018-08-13 DIAGNOSIS — J069 Acute upper respiratory infection, unspecified: Secondary | ICD-10-CM | POA: Diagnosis not present

## 2018-08-13 DIAGNOSIS — J189 Pneumonia, unspecified organism: Secondary | ICD-10-CM | POA: Diagnosis not present

## 2018-09-28 DIAGNOSIS — F3342 Major depressive disorder, recurrent, in full remission: Secondary | ICD-10-CM | POA: Diagnosis not present

## 2018-09-28 DIAGNOSIS — N952 Postmenopausal atrophic vaginitis: Secondary | ICD-10-CM | POA: Diagnosis not present

## 2018-09-28 DIAGNOSIS — E559 Vitamin D deficiency, unspecified: Secondary | ICD-10-CM | POA: Diagnosis not present

## 2018-09-28 DIAGNOSIS — Z0000 Encounter for general adult medical examination without abnormal findings: Secondary | ICD-10-CM | POA: Diagnosis not present

## 2018-09-28 DIAGNOSIS — M85852 Other specified disorders of bone density and structure, left thigh: Secondary | ICD-10-CM | POA: Diagnosis not present

## 2018-09-28 DIAGNOSIS — Z131 Encounter for screening for diabetes mellitus: Secondary | ICD-10-CM | POA: Diagnosis not present

## 2018-10-18 ENCOUNTER — Other Ambulatory Visit: Payer: Self-pay | Admitting: Family Medicine

## 2018-10-18 DIAGNOSIS — Z1231 Encounter for screening mammogram for malignant neoplasm of breast: Secondary | ICD-10-CM

## 2018-12-14 ENCOUNTER — Ambulatory Visit
Admission: RE | Admit: 2018-12-14 | Discharge: 2018-12-14 | Disposition: A | Discharge status: Home / Self Care | Payer: BC Managed Care – PPO | Source: Ambulatory Visit | Attending: Family Medicine | Admitting: Family Medicine

## 2018-12-14 ENCOUNTER — Other Ambulatory Visit: Payer: Self-pay

## 2018-12-14 DIAGNOSIS — Z1231 Encounter for screening mammogram for malignant neoplasm of breast: Secondary | ICD-10-CM | POA: Diagnosis not present

## 2018-12-31 DIAGNOSIS — M5412 Radiculopathy, cervical region: Secondary | ICD-10-CM | POA: Diagnosis not present

## 2018-12-31 DIAGNOSIS — M9903 Segmental and somatic dysfunction of lumbar region: Secondary | ICD-10-CM | POA: Diagnosis not present

## 2018-12-31 DIAGNOSIS — M9901 Segmental and somatic dysfunction of cervical region: Secondary | ICD-10-CM | POA: Diagnosis not present

## 2019-01-02 DIAGNOSIS — M5412 Radiculopathy, cervical region: Secondary | ICD-10-CM | POA: Diagnosis not present

## 2019-01-02 DIAGNOSIS — M9901 Segmental and somatic dysfunction of cervical region: Secondary | ICD-10-CM | POA: Diagnosis not present

## 2019-01-02 DIAGNOSIS — M9903 Segmental and somatic dysfunction of lumbar region: Secondary | ICD-10-CM | POA: Diagnosis not present

## 2019-01-03 DIAGNOSIS — M9901 Segmental and somatic dysfunction of cervical region: Secondary | ICD-10-CM | POA: Diagnosis not present

## 2019-01-03 DIAGNOSIS — M9903 Segmental and somatic dysfunction of lumbar region: Secondary | ICD-10-CM | POA: Diagnosis not present

## 2019-01-03 DIAGNOSIS — M5412 Radiculopathy, cervical region: Secondary | ICD-10-CM | POA: Diagnosis not present

## 2019-01-07 DIAGNOSIS — M9903 Segmental and somatic dysfunction of lumbar region: Secondary | ICD-10-CM | POA: Diagnosis not present

## 2019-01-07 DIAGNOSIS — M9901 Segmental and somatic dysfunction of cervical region: Secondary | ICD-10-CM | POA: Diagnosis not present

## 2019-01-07 DIAGNOSIS — M5412 Radiculopathy, cervical region: Secondary | ICD-10-CM | POA: Diagnosis not present

## 2019-01-08 DIAGNOSIS — M9903 Segmental and somatic dysfunction of lumbar region: Secondary | ICD-10-CM | POA: Diagnosis not present

## 2019-01-08 DIAGNOSIS — M5412 Radiculopathy, cervical region: Secondary | ICD-10-CM | POA: Diagnosis not present

## 2019-01-08 DIAGNOSIS — M9901 Segmental and somatic dysfunction of cervical region: Secondary | ICD-10-CM | POA: Diagnosis not present

## 2019-01-10 DIAGNOSIS — M9903 Segmental and somatic dysfunction of lumbar region: Secondary | ICD-10-CM | POA: Diagnosis not present

## 2019-01-10 DIAGNOSIS — M9901 Segmental and somatic dysfunction of cervical region: Secondary | ICD-10-CM | POA: Diagnosis not present

## 2019-01-10 DIAGNOSIS — M5412 Radiculopathy, cervical region: Secondary | ICD-10-CM | POA: Diagnosis not present

## 2019-01-14 DIAGNOSIS — M9903 Segmental and somatic dysfunction of lumbar region: Secondary | ICD-10-CM | POA: Diagnosis not present

## 2019-01-14 DIAGNOSIS — M5412 Radiculopathy, cervical region: Secondary | ICD-10-CM | POA: Diagnosis not present

## 2019-01-14 DIAGNOSIS — M9901 Segmental and somatic dysfunction of cervical region: Secondary | ICD-10-CM | POA: Diagnosis not present

## 2019-01-17 DIAGNOSIS — M9903 Segmental and somatic dysfunction of lumbar region: Secondary | ICD-10-CM | POA: Diagnosis not present

## 2019-01-17 DIAGNOSIS — M9901 Segmental and somatic dysfunction of cervical region: Secondary | ICD-10-CM | POA: Diagnosis not present

## 2019-01-17 DIAGNOSIS — M5412 Radiculopathy, cervical region: Secondary | ICD-10-CM | POA: Diagnosis not present

## 2019-01-22 DIAGNOSIS — M5412 Radiculopathy, cervical region: Secondary | ICD-10-CM | POA: Diagnosis not present

## 2019-01-22 DIAGNOSIS — M9901 Segmental and somatic dysfunction of cervical region: Secondary | ICD-10-CM | POA: Diagnosis not present

## 2019-01-22 DIAGNOSIS — M9903 Segmental and somatic dysfunction of lumbar region: Secondary | ICD-10-CM | POA: Diagnosis not present

## 2019-01-23 DIAGNOSIS — L03119 Cellulitis of unspecified part of limb: Secondary | ICD-10-CM | POA: Diagnosis not present

## 2019-01-24 DIAGNOSIS — M9901 Segmental and somatic dysfunction of cervical region: Secondary | ICD-10-CM | POA: Diagnosis not present

## 2019-01-24 DIAGNOSIS — M9903 Segmental and somatic dysfunction of lumbar region: Secondary | ICD-10-CM | POA: Diagnosis not present

## 2019-01-24 DIAGNOSIS — M5412 Radiculopathy, cervical region: Secondary | ICD-10-CM | POA: Diagnosis not present

## 2019-01-29 DIAGNOSIS — M9901 Segmental and somatic dysfunction of cervical region: Secondary | ICD-10-CM | POA: Diagnosis not present

## 2019-01-29 DIAGNOSIS — M9903 Segmental and somatic dysfunction of lumbar region: Secondary | ICD-10-CM | POA: Diagnosis not present

## 2019-01-29 DIAGNOSIS — M5412 Radiculopathy, cervical region: Secondary | ICD-10-CM | POA: Diagnosis not present

## 2019-02-04 DIAGNOSIS — M9903 Segmental and somatic dysfunction of lumbar region: Secondary | ICD-10-CM | POA: Diagnosis not present

## 2019-02-04 DIAGNOSIS — M9901 Segmental and somatic dysfunction of cervical region: Secondary | ICD-10-CM | POA: Diagnosis not present

## 2019-02-04 DIAGNOSIS — M5412 Radiculopathy, cervical region: Secondary | ICD-10-CM | POA: Diagnosis not present

## 2019-02-26 ENCOUNTER — Other Ambulatory Visit: Payer: Self-pay | Admitting: Genetic Counselor

## 2019-02-26 DIAGNOSIS — Z803 Family history of malignant neoplasm of breast: Secondary | ICD-10-CM

## 2019-02-27 ENCOUNTER — Other Ambulatory Visit: Payer: Self-pay | Admitting: Genetic Counselor

## 2019-02-27 ENCOUNTER — Inpatient Hospital Stay: Payer: BC Managed Care – PPO | Attending: Genetic Counselor | Admitting: Genetic Counselor

## 2019-02-27 ENCOUNTER — Other Ambulatory Visit: Payer: Self-pay

## 2019-02-27 ENCOUNTER — Inpatient Hospital Stay: Payer: BC Managed Care – PPO

## 2019-02-27 ENCOUNTER — Encounter: Payer: Self-pay | Admitting: Genetic Counselor

## 2019-02-27 DIAGNOSIS — Z803 Family history of malignant neoplasm of breast: Secondary | ICD-10-CM | POA: Diagnosis not present

## 2019-02-27 DIAGNOSIS — Z8051 Family history of malignant neoplasm of kidney: Secondary | ICD-10-CM | POA: Insufficient documentation

## 2019-02-27 DIAGNOSIS — Z808 Family history of malignant neoplasm of other organs or systems: Secondary | ICD-10-CM | POA: Diagnosis not present

## 2019-02-27 DIAGNOSIS — Z8042 Family history of malignant neoplasm of prostate: Secondary | ICD-10-CM | POA: Diagnosis not present

## 2019-02-27 NOTE — Progress Notes (Signed)
REFERRING PROVIDER: Gweneth DimitriMcNeill, Wendy, MD [address] [place] [id number]  PRIMARY PROVIDER:  Gweneth DimitriMcNeill, Wendy, MD  PRIMARY REASON FOR VISIT:  1. Family history of kidney cancer   2. Family history of melanoma   3. Family history of breast cancer   4. Family history of prostate cancer      HISTORY OF PRESENT ILLNESS:   [name], a 63 y.o. female, was seen for a [place] cancer genetics consultation at the request of Dr. Corliss BlackerMcNeill due to a family history of cancer.  [name] presents to clinic today to discuss the possibility of a hereditary predisposition to cancer, genetic testing, and to further clarify her future cancer risks, as well as potential cancer risks for family members.   [name] is a 63 y.o. female with no personal history of cancer.  Several family members have undergone genetic testing and have been found to have at least one of two hereditary gene mutations, the first in FH called [id number] and the second [facility] called c.91C>T.  CANCER HISTORY:  Oncology History   No history exists.     RISK FACTORS:  Menarche was at age 63.  First live birth at age 63.  OCP use for approximately 7 years.  Ovaries intact: no.  Hysterectomy: yes.  Menopausal status: postmenopausal.  HRT use: 5 years. Colonoscopy: yes; normal. Mammogram within the last year: yes. Number of breast biopsies: 0. Up to date with pelvic exams: yes. Any excessive radiation exposure in the past: no  Past Medical History:  Diagnosis Date  . Depression   . Family history of breast cancer   . Family history of kidney cancer   . Family history of melanoma   . Family history of prostate cancer     Past Surgical History:  Procedure Laterality Date  . ABDOMINAL HYSTERECTOMY     DUB/fibroids  . BLADDER SUSPENSION     Evans  . SHOULDER SURGERY    . TONSILLECTOMY    . TUBAL LIGATION      Social History   Socioeconomic History  . Marital status: Married    Spouse  name: Not on file  . Number of children: Not on file  . Years of education: Not on file  . Highest education level: Not on file  Occupational History  . Not on file  Social Needs  . Financial resource strain: Not on file  . Food insecurity    Worry: Not on file    Inability: Not on file  . Transportation needs    Medical: Not on file    Non-medical: Not on file  Tobacco Use  . Smoking status: Never Smoker  Substance and Sexual Activity  . Alcohol use: No  . Drug use: No  . Sexual activity: Yes    Birth control/protection: Post-menopausal  Lifestyle  . Physical activity    Days per week: Not on file    Minutes per session: Not on file  . Stress: Not on file  Relationships  . Social Musicianconnections    Talks on phone: Not on file    Gets together: Not on file    Attends religious service: Not on file    Active member of club or organization: Not on file    Attends meetings of clubs or organizations: Not on file    Relationship status: Not on file  Other Topics Concern  . Not on file  Social History Narrative  . Not on file  FAMILY HISTORY:  We obtained a detailed, 4-generation family history.  Significant diagnoses are listed below: Family History  Problem Relation Age of Onset  . Liver disease Mother   . Breast cancer Mother 40  . Prostate cancer Father 51  . Kidney cancer Father 13  . Melanoma Father 44  . Melanoma Sister 52  . Diabetes Brother   . Prostate cancer Brother 54  . Testicular cancer Son 15  . Diabetes Maternal Grandmother   . Heart attack Maternal Grandfather   . Heart disease Paternal Grandmother   . Dementia Paternal Grandfather   . Other Brother 1       blood disease  . Breast cancer Cousin 70       paternal first cousin  . Breast cancer Cousin 32       paternal first cousin  . Breast cancer Niece 51  . Melanoma Niece 85  . Brain cancer Maternal Aunt 42       d. 46  . Lung cancer Maternal Uncle   . Dementia Paternal Aunt   . Kidney  cancer Paternal Uncle 19  . Breast cancer Maternal Aunt   . Throat cancer Maternal Uncle   . Breast cancer Cousin        maternal first cousin dx >50  . Breast cancer Cousin        maternal first cousin dx >50  . Prostate cancer Paternal Uncle        d. 56  . Heart attack Paternal Uncle 23    The patient has two sons. Her youngest son was diagnosed with testicular cancer at 49 and a yolk sac teratoma at 12.  She has two brothers and a sister.  One brother has been diagnosed with prostate cancer at 51, one brother died of a blood condition at 18 months, and her sister has had melanoma at 65.  Her sister's daughter was diagnosed with melanoma at 54 and breast cancer at 36.  Her parents are deceased.  The patient father was diagnosed with melanoma at 77, and prostate and kidney cancer at 58.  He had a brother who had prostate cancer, a second brother with kidney cancer and a third brother who died of a heart attack at 34.  This latter brother had two daughters with breast cancer, one at age 82.  He had four other siblings who did not have breast cancer.  The paternal grandparents are deceased from non-cancer related issues.  The patient mother had stage 1, ER+ breast cancer at 15.  She had two brothers who were smokers and had lung cancer, a brother with throat cancer, a sister with breast cancer, a sister with brain cancer at 17 and two sisters without cancer.  One sister without cancer had two daughters with breast cancer.  The maternal grandparents are deceased from non-cancer related issues.  [name] is aware of previous family history of genetic testing for hereditary cancer risks. Patient's maternal ancestors are of [place] and [place] descent, and paternal ancestors are of [place], [place] and [place] descent. There is no reported Ashkenazi Jewish ancestry. There is no known consanguinity.  GENETIC COUNSELING ASSESSMENT: Ms. Lomelino is a 63 y.o. female with a family history of cancer which  is somewhat suggestive of a hereditary cancer syndrome and predisposition to cancer given the number of individuals with cancer, some at young ages. We, therefore, discussed and recommended the following at today's visit.   DISCUSSION: We discussed that 5 - 10% of cancer  is hereditary.  Each cancer has its own risk for hereditary risks. The patient's niece underwent genetic testing and was found to have a pathogenic variant in [place] called [phone], a pathogenic variant in [place] called c.91C>T, and a VUS in P[place]1 called c.2989G>A.  Additionally, her brother underwent genetic testing and was found to have the [place] c.[id number] pathogenic variant as well.  Therefore, Ms. Mabee has a 50% chance of testing positive for the FH gene mutation and a 25% chance for each of the POLD1 VUS and the SDHA pathogenic variant.  HEREDITARY LEIOMYOMATOSIS AND RENAL CELLCANCER:  Hereditary leiomyomatosis and renal cell cancer (HLRCC) is acondition that causesaffected individuals to develop benign leiomyomas in the skin and the uterus (fibroids). This condition also increases the risk ofrenal cancer, specifically Papillary type 2 renal cancer.  Cutaneous leiomyomas- typically develop in the third decade of life. These arebenign lesions that can be painful forsome individuals. The number of cutaneous leiomyomas varies greatly among individuals but tends to increase in size and number over time.   Uterine Leiomyomas-Most women with HLRCC also develop uterine leiomyomas (fibroids). While uterine fibroids are very common in the general population, women with HLRCC tend to have numerous large fibroids that appear earlier than in the general population.  Approximately10%-16%percent of people with HLRCC develop renal cell cancer. The average age of diagnosis of renal cancer in people with HLRCCis in their forties, however diagnosis as early as childhood has been reported.Most tumorsseen with this  condition aretype 2 papillary renal cancer. However, other types of renal tumorshave been reportedincluding tubulo-papillary renal cell carcinomasandcollecting-duct renal cell carcinomas.The renal cancers described in [place] patients also tend to be more aggressive.  FH [id number]:This particular variant found in Ms. Sobczak's brother and niece  has been somewhat controversial on its role in the actual development of HLRCC. A study published in January 2020 looked at 24 individuals with this exact variant, none of whom was affected with kidney cancer. They also looked at 372 patients with renal cancer, 11 of which had an FH variant associated with HLRCC. None had this particular variant. (DOI: 10.1002/humu.23900). A second study, presented as an abstract at the [employer] looked at a separate cohort of individuals with the [place] [phone] variant. None of those individuals had kidney cancer, or other features of HLRCC. Both studies suggest that this variant is not associated with cancer, including renal cell carcinoma.  We discussed that Ms. Malacara's family history is complicated, as her father is reported to have had kidney cancer and her mother is reported to have uterine fibroids. We do not know if her father had this particular FH variant. We discussed offering genetic testing to her cousins to learn which side of the family this variant is coming from. We discussed that screening for cutaneous leiomyoma's in non-invasive and can be part of a general skin exam. We also discussed that we could base our decision on whether to screen for kidney cancer based on which side of the family this variant is identified.   This variant has been seen in individuals with autosomal recessive fumerate hydratase deficiency (FHD). Individuals with mutations in both copies of their FH gene have a condition calledautosomal recessive Fumarate  HydrataseDeficiency. Iftwopeople who are carriers of anFH mutation have children together, there is a25%risk for their childrento haveFHD. Individuals with a FH mutation may consider further genetic counseling/genetic testing of their partner when considering reproductive decisions.   Based on her risk for the SDHA variant, we  did not discuss this at length.  We will offer genetic counseling after the results come back if she tests positive for this variant.  PLAN: After considering the risks, benefits, and limitations, [name] provided informed consent to pursue genetic testing and the blood sample was [facility] for analysis of the Custom[facility]-Cancer+RNAinsight panel. Results should be available within approximately 2-3 weeks' time, at which point they will be disclosed by telephone to Ms. Lucchesi, as will any additional recommendations warranted by these results. [name] will receive a summary of her genetic counseling visit and a copy of her results once available. This information will also be available in Epic.   Lastly, we encouraged Ms. Bufford to remain in contact with cancer genetics annually so that we can continuously update the family history and inform her of any changes in cancer genetics and testing that may be of benefit for this family.   Ms. Valinda PartyDunning's questions were answered to her satisfaction today. Our contact information was provided should additional questions or concerns arise. Thank you for the referral and allowing us to share in the care of your patient.   Karen P. Lowell GuitarPowell, MS, [facility] Licensed, Patent attorneyCertified Genetic Counselor [email or web address] phone: [phone]  The patient was seen for a total of 45 minutes in face-to-face genetic counseling.  This patient was discussed with Drs. Magrinat, Pamelia HoitGudena and/or Mosetta PuttFeng who agrees with the above.    _______________________________________________________________________ For Office  Staff:  Number of people involved in session: 1 Was an Intern/ student involved with case: yes: Amy Terrilee CroakKnight

## 2019-02-28 ENCOUNTER — Inpatient Hospital Stay: Payer: BC Managed Care – PPO

## 2019-02-28 ENCOUNTER — Other Ambulatory Visit: Payer: Self-pay

## 2019-02-28 DIAGNOSIS — Z803 Family history of malignant neoplasm of breast: Secondary | ICD-10-CM

## 2019-03-05 IMAGING — MG DIGITAL SCREENING BILATERAL MAMMOGRAM WITH TOMO AND CAD
8 series · 8 of 24 positions shown · non-contrast
Comparison: Previous exam(s).

CLINICAL DATA: Screening.

EXAM:
DIGITAL SCREENING BILATERAL MAMMOGRAM WITH TOMO AND CAD

[R CC synth-2D]
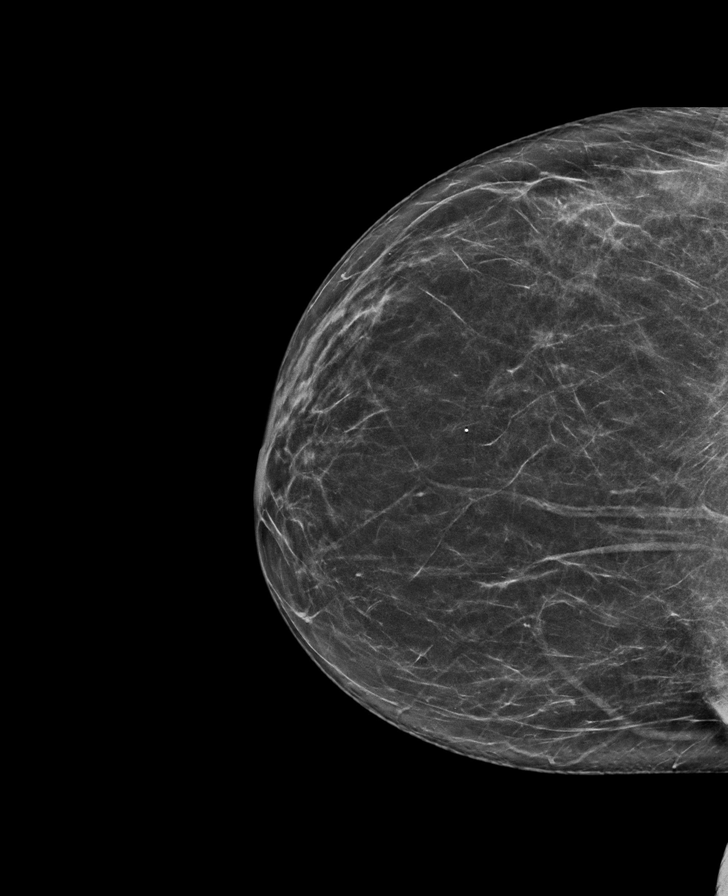

[L MLO synth-2D]
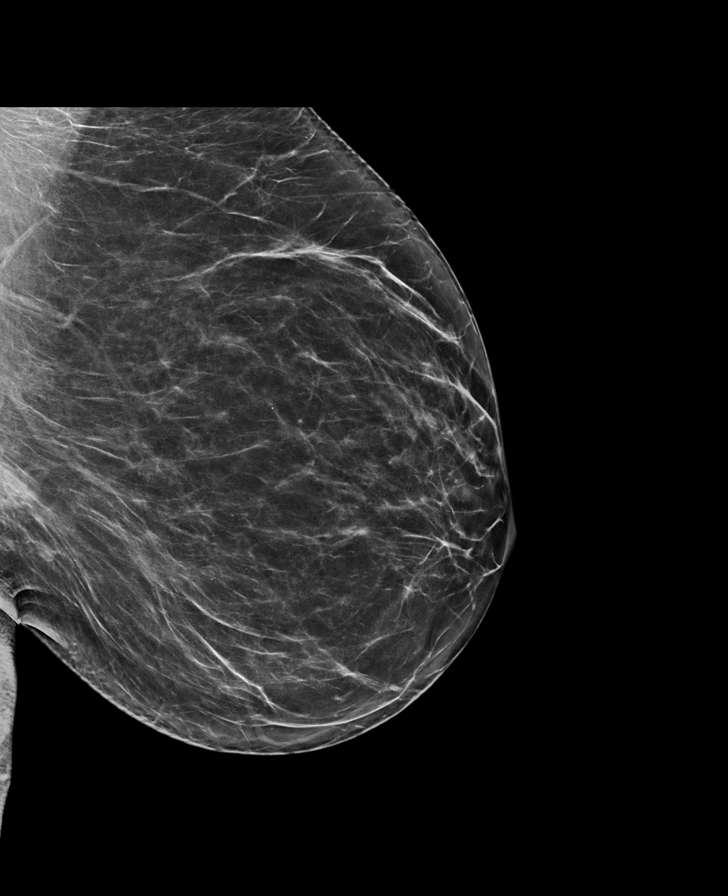

[L CC synth-2D]
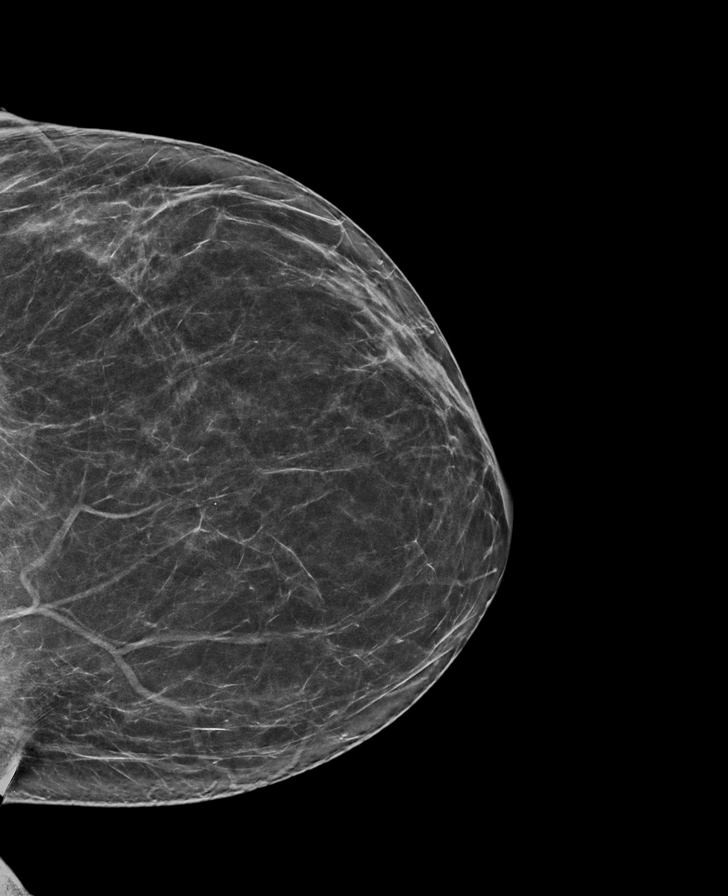

[R MLO synth-2D]
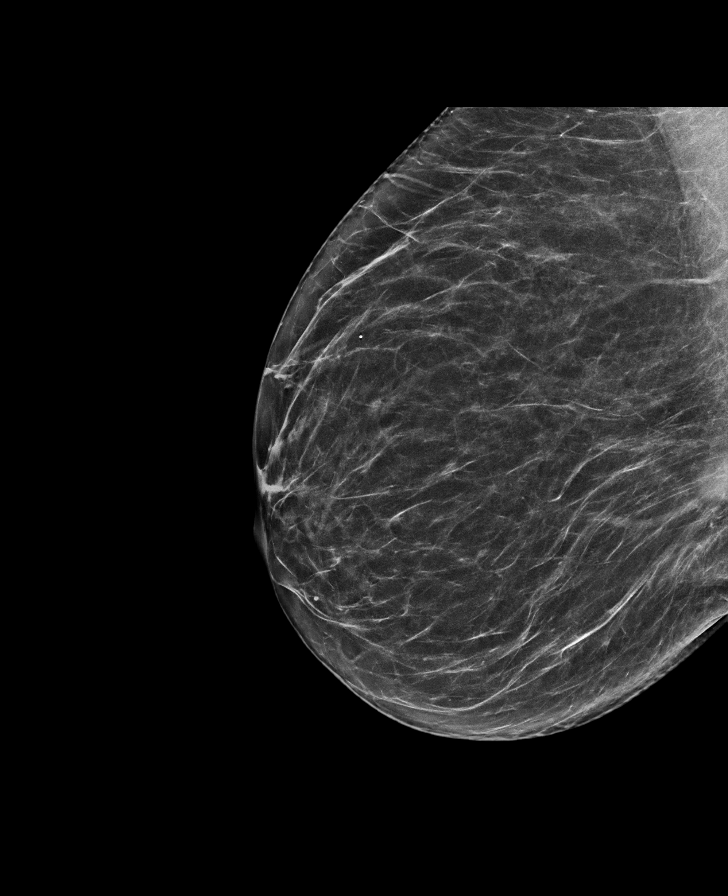

[L CC tomo · tomo slice 35/70.0]
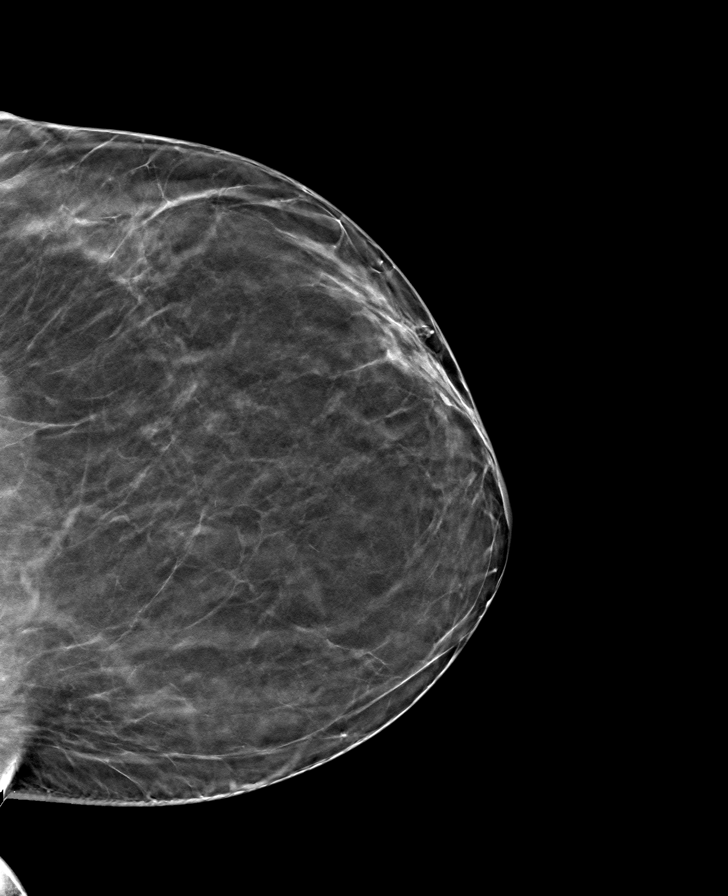

[R MLO tomo · tomo slice 37/74.0]
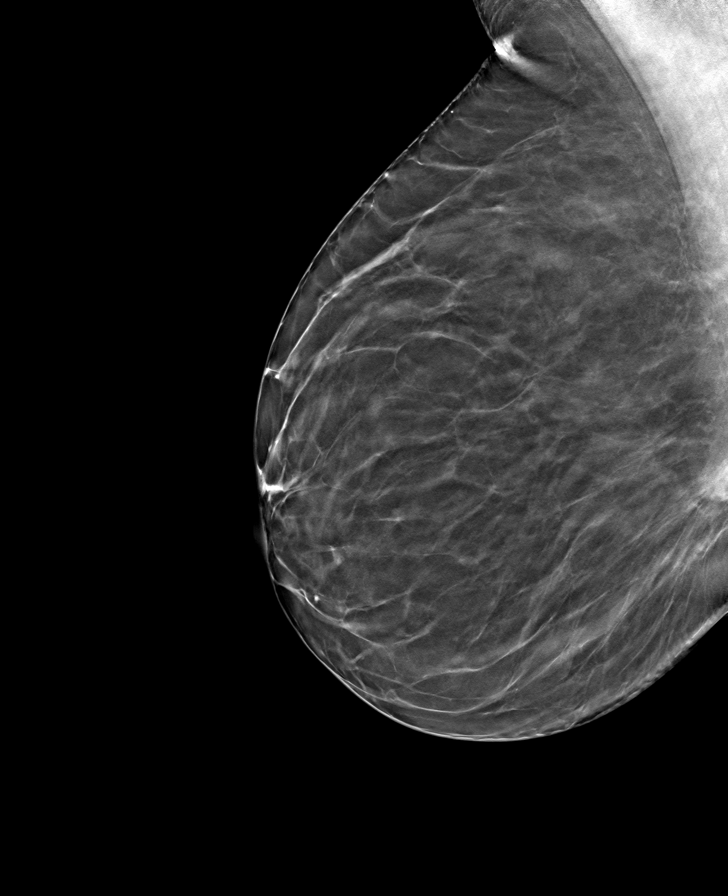

[L MLO tomo · tomo slice 39/77.0]
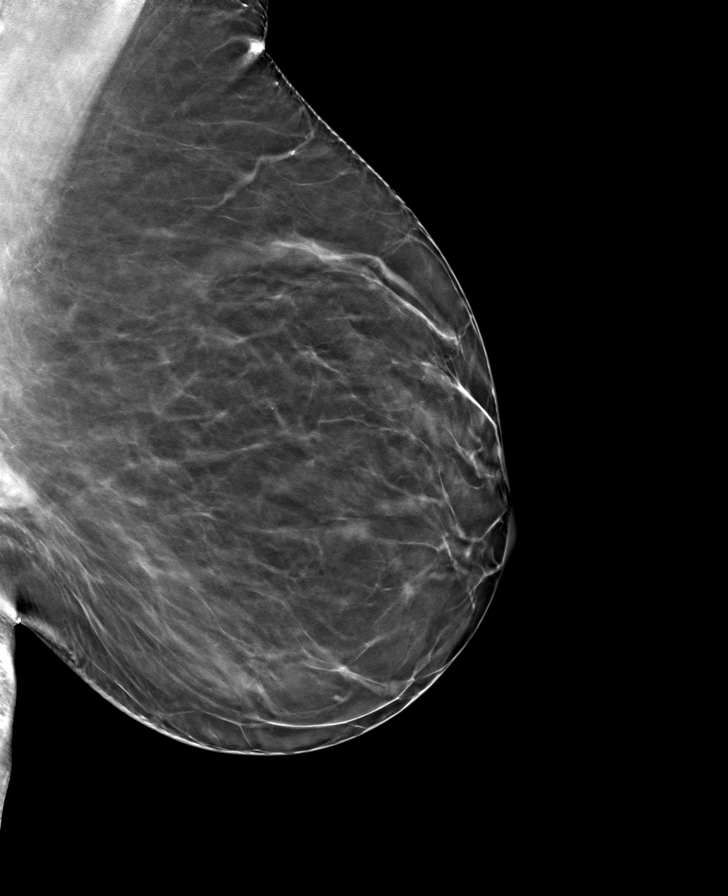

[R CC tomo · tomo slice 35/68.0]
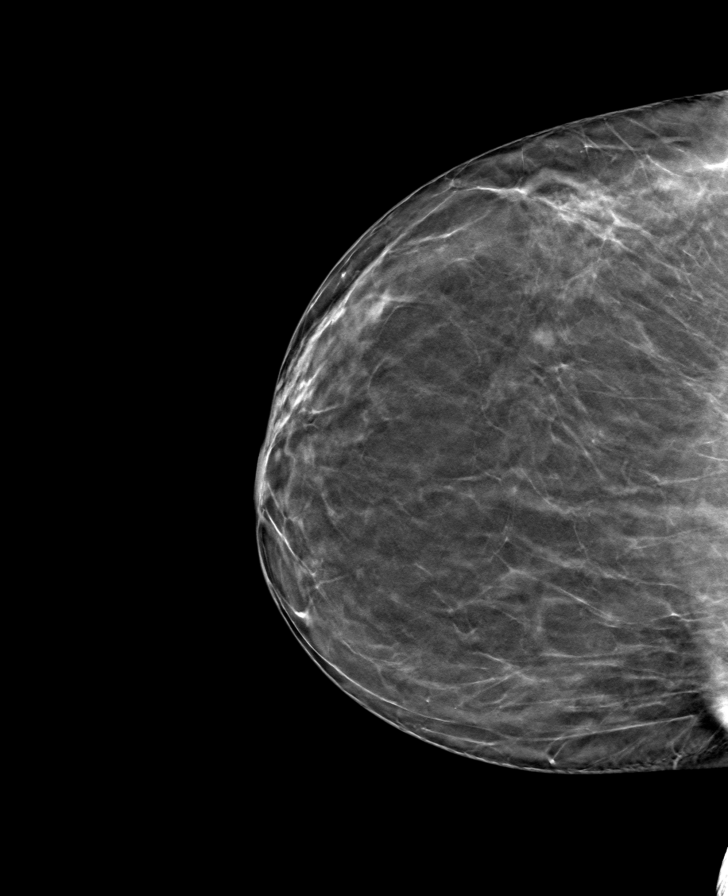

[8 of 24 positions shown; findings below may reference images not displayed]

ACR Breast Density Category b: There are scattered areas of
fibroglandular density.
FINDINGS: There are no findings suspicious for malignancy. Images were
processed with CAD.
IMPRESSION: No mammographic evidence of malignancy. A result letter of this
screening mammogram will be mailed directly to the patient.

RECOMMENDATION:
Screening mammogram in one year. (Code:[id number])

BI-RADS CATEGORY  1: Negative.

## 2019-03-14 ENCOUNTER — Encounter: Payer: Self-pay | Admitting: Genetic Counselor

## 2019-03-14 ENCOUNTER — Ambulatory Visit: Payer: Self-pay | Admitting: Genetic Counselor

## 2019-03-14 ENCOUNTER — Telehealth: Payer: Self-pay | Admitting: Genetic Counselor

## 2019-03-14 DIAGNOSIS — Z1379 Encounter for other screening for genetic and chromosomal anomalies: Secondary | ICD-10-CM | POA: Insufficient documentation

## 2019-03-14 NOTE — Telephone Encounter (Signed)
Revealed negative genetic testing.  Discussed that we do not know why there is cancer in the family. It could be due to a different gene that we are not testing, or maybe our current technology may not be able to pick something up.  It will be important for her to keep in contact with genetics to keep up with whether additional testing may be needed.   There is a VUS in RET that will not change her medical management.

## 2019-03-14 NOTE — Progress Notes (Signed)
HPI:  Judith Roth was previously seen in the [place] clinic due to a family history of cancer and concerns regarding a hereditary predisposition to cancer. Please refer to our prior cancer genetics clinic note for more information regarding our discussion, assessment and recommendations, at the time. Judith Roth recent genetic test results were disclosed to her, as were recommendations warranted by these results. These results and recommendations are discussed in more detail below.  CANCER HISTORY:  Oncology History   No history exists.    FAMILY HISTORY:  We obtained a detailed, 4-generation family history.  Significant diagnoses are listed below: Family History  Problem Relation Age of Onset   Liver disease Mother    Breast cancer Mother 54   Prostate cancer Father 67   Kidney cancer Father 5   Melanoma Father 9   Melanoma Sister 75   Diabetes Brother    Prostate cancer Brother 21   Testicular cancer Son 18   Diabetes Maternal Grandmother    Heart attack Maternal Grandfather    Heart disease Paternal Grandmother    Dementia Paternal Grandfather    Other Brother 1       blood disease   Breast cancer Cousin 41       paternal first cousin   Breast cancer Cousin 18       paternal first cousin   Breast cancer Niece 13   Melanoma Niece 49   Brain cancer Maternal Aunt 42       d. 55   Lung cancer Maternal Uncle    Dementia Paternal Aunt    Kidney cancer Paternal Uncle 79   Breast cancer Maternal Aunt    Throat cancer Maternal Uncle    Breast cancer Cousin        maternal first cousin dx >50   Breast cancer Cousin        maternal first cousin dx >50   Prostate cancer Paternal Uncle        d. 75   Heart attack Paternal Uncle 55    The patient has two sons. Her youngest son was diagnosed with testicular cancer at 49 and a yolk sac teratoma at 54.  She has two brothers and a sister.  One brother has been diagnosed with  prostate cancer at 91, one brother died of a blood condition at 79 months, and her sister has had melanoma at 15.  Her sister's daughter was diagnosed with melanoma at 71 and breast cancer at 94.  Her parents are deceased.  The patient father was diagnosed with melanoma at 51, and prostate and kidney cancer at 52.  He had a brother who had prostate cancer, a second brother with kidney cancer and a third brother who died of a heart attack at 78.  This latter brother had two daughters with breast cancer, one at age 93.  He had four other siblings who did not have breast cancer.  The paternal grandparents are deceased from non-cancer related issues.  The patient mother had stage 1, ER+ breast cancer at 60.  She had two brothers who were smokers and had lung cancer, a brother with throat cancer, a sister with breast cancer, a sister with brain cancer at 72 and two sisters without cancer.  One sister without cancer had two daughters with breast cancer.  The maternal grandparents are deceased from non-cancer related issues.  Judith Roth is aware of previous family history of genetic testing for hereditary cancer risks. Patient's maternal ancestors  are of [place] and [place] descent, and paternal ancestors are of [place], [place] and [place] descent. There is no reported Ashkenazi Jewish ancestry. There is no known consanguinity.   GENETIC TEST RESULTS: Genetic testing reported out on March 12, 2019 through the [facility] cancer panel found no pathogenic mutations. Judith Roth test did not reveal the familial mutation in [place] or SDHA. We call this result a true negative result because the cancer-causing mutation was identified in Judith Roth's family, and she did not inherit it.  Given this negative result, Judith Roth chances of developing FH or SDHA-related cancers are the same as they are in the general population.  The [facility]cer gene panel offered by [facility] and includes  sequencing and rearrangement analysis for the following 91 genes: AIP, ALK, APC*, ATM*, AXIN2, BAP1, BARD1, BLM, BMPR1A, BRCA1*, BRCA2*, BRIP1*, CDC73, CDH1*, CDK4, CDKN1B, CDKN2A, CHEK2*, CTNNA1, DICER1, FANCC, FH, FLCN, GALNT12, KIF1B, LZTR1, MAX, MEN1, MET, MLH1*, MRE11A, MSH2*, MSH3, MSH6*, MUTYH*, NBN, NF1*, NF2, NTHL1, PALB2*, PHOX2B, PMS2*, POT1, PRKAR1A, PTCH1, PTEN*, RAD50, RAD51C*, RAD51D*, RB1, RECQL, RET, SDHA, SDHAF2, SDHB, SDHC, SDHD, SMAD4, SMARCA4, SMARCB1, SMARCE1, STK11, SUFU, TMEM127, TP53*, TSC1, TSC2, VHL and XRCC2 (sequencing and deletion/duplication); CASR, CFTR, CPA1, CTRC, EGFR, EGLN1, FAM175A, HOXB13, KIT, MITF, MLH3, PALLD, PDGFRA, POLD1, POLE, PRSS1, RINT1, RPS20, SPINK1 and TERT (sequencing only); EPCAM and GREM1 (deletion/duplication only). DNA and RNA analyses performed for * genes. The test report has been scanned into EPIC and is located under the Molecular Pathology section of the Results Review tab.  A portion of the result report is included below for reference.     We discussed with Judith Roth that because current genetic testing is not perfect, it is possible there may be a gene mutation in one of these genes that current testing cannot detect, but that chance is small.  We also discussed, that there could be another gene that has not yet been discovered, or that we have not yet tested, that is responsible for the cancer diagnoses in the family. It is also possible there is a hereditary cause for the cancer in the family that Judith Roth did not inherit and therefore was not identified in her testing.  Therefore, it is important to remain in touch with cancer genetics in the future so that we can continue to offer Judith Roth the most up to date genetic testing.   Genetic testing did identify a variant of uncertain significance (VUS) was identified in the RET gene called p.T1038I.  At this time, it is unknown if this variant is associated with increased cancer risk or if  this is a normal finding, but most variants such as this get reclassified to being inconsequential. It should not be used to make medical management decisions. With time, we suspect the lab will determine the significance of this variant, if any. If we do learn more about it, we will try to contact Judith Roth to discuss it further. However, it is important to stay in touch with [place] periodically and keep the address and phone number up to date.  ADDITIONAL GENETIC TESTING: We discussed with Judith Roth that her genetic testing was fairly extensive.  If there are genes identified to increase cancer risk that can be analyzed in the future, we would be happy to discuss and coordinate this testing at that time.    CANCER SCREENING RECOMMENDATIONS: Judith Roth test result is considered negative (normal).  This means that we have not identified a hereditary cause for her  family history of cancer at this time. Most cancers happen by chance and this negative test suggests that her cancer may fall into this category.    While reassuring, this does not definitively rule out a hereditary predisposition to cancer. It is still possible that there could be genetic mutations that are undetectable by current technology. There could be genetic mutations in genes that have not been tested or identified to increase cancer risk.  Therefore, it is recommended she continue to follow the cancer management and screening guidelines provided by her primary healthcare provider.   An individual's cancer risk and medical management are not determined by genetic test results alone. Overall cancer risk assessment incorporates additional factors, including personal medical history, family history, and any available genetic information that may result in a personalized plan for cancer prevention and surveillance  RECOMMENDATIONS FOR FAMILY MEMBERS:  Individuals in this family might be at some increased risk of developing cancer, over the  general population risk, simply due to the family history of cancer.  We recommended women in this family have a yearly mammogram beginning at age 85, or 50 years younger than the earliest onset of cancer, an annual clinical breast exam, and perform monthly breast self-exams. Women in this family should also have a gynecological exam as recommended by their primary provider. All family members should have a colonoscopy by age 15.  FOLLOW-UP: Lastly, we discussed with Judith Roth that cancer genetics is a rapidly advancing field and it is possible that new genetic tests will be appropriate for her and/or her family members in the future. We encouraged her to remain in contact with cancer genetics on an annual basis so we can update her personal and family histories and let her know of advances in cancer genetics that may benefit this family.   Our contact number was provided. Judith Roth questions were answered to her satisfaction, and she knows she is welcome to call us at anytime with additional questions or concerns.   Roma Kayser, [place], [facility] Licensed, Certified Genetic Counselor [email or web address]

## 2019-04-03 DIAGNOSIS — M5412 Radiculopathy, cervical region: Secondary | ICD-10-CM | POA: Diagnosis not present

## 2019-04-03 DIAGNOSIS — M9903 Segmental and somatic dysfunction of lumbar region: Secondary | ICD-10-CM | POA: Diagnosis not present

## 2019-04-03 DIAGNOSIS — M9901 Segmental and somatic dysfunction of cervical region: Secondary | ICD-10-CM | POA: Diagnosis not present

## 2019-04-04 DIAGNOSIS — D2262 Melanocytic nevi of left upper limb, including shoulder: Secondary | ICD-10-CM | POA: Diagnosis not present

## 2019-04-04 DIAGNOSIS — L819 Disorder of pigmentation, unspecified: Secondary | ICD-10-CM | POA: Diagnosis not present

## 2019-04-04 DIAGNOSIS — D225 Melanocytic nevi of trunk: Secondary | ICD-10-CM | POA: Diagnosis not present

## 2019-04-04 DIAGNOSIS — D2261 Melanocytic nevi of right upper limb, including shoulder: Secondary | ICD-10-CM | POA: Diagnosis not present

## 2019-05-14 DIAGNOSIS — M9903 Segmental and somatic dysfunction of lumbar region: Secondary | ICD-10-CM | POA: Diagnosis not present

## 2019-05-14 DIAGNOSIS — M9901 Segmental and somatic dysfunction of cervical region: Secondary | ICD-10-CM | POA: Diagnosis not present

## 2019-05-14 DIAGNOSIS — M5412 Radiculopathy, cervical region: Secondary | ICD-10-CM | POA: Diagnosis not present

## 2019-06-11 DIAGNOSIS — M9903 Segmental and somatic dysfunction of lumbar region: Secondary | ICD-10-CM | POA: Diagnosis not present

## 2019-06-11 DIAGNOSIS — M9901 Segmental and somatic dysfunction of cervical region: Secondary | ICD-10-CM | POA: Diagnosis not present

## 2019-06-11 DIAGNOSIS — M5412 Radiculopathy, cervical region: Secondary | ICD-10-CM | POA: Diagnosis not present

## 2019-07-25 DIAGNOSIS — G44209 Tension-type headache, unspecified, not intractable: Secondary | ICD-10-CM | POA: Diagnosis not present

## 2019-07-25 DIAGNOSIS — R05 Cough: Secondary | ICD-10-CM | POA: Diagnosis not present

## 2019-07-25 DIAGNOSIS — R0981 Nasal congestion: Secondary | ICD-10-CM | POA: Diagnosis not present

## 2019-07-25 DIAGNOSIS — Z03818 Encounter for observation for suspected exposure to other biological agents ruled out: Secondary | ICD-10-CM | POA: Diagnosis not present

## 2019-08-12 DIAGNOSIS — B029 Zoster without complications: Secondary | ICD-10-CM | POA: Diagnosis not present

## 2019-09-01 ENCOUNTER — Ambulatory Visit: Payer: BC Managed Care – PPO | Attending: Internal Medicine

## 2019-09-01 DIAGNOSIS — Z23 Encounter for immunization: Secondary | ICD-10-CM | POA: Insufficient documentation

## 2019-09-01 NOTE — Progress Notes (Signed)
   Covid-19 Vaccination Clinic  Name:  Judith Roth    MRN: [id number] DOB: 05/02/1956  09/01/2019  Judith Roth was observed post Covid-19 immunization for 15 minutes without incident. She was provided with Vaccine Information Sheet and instruction to access the V-Safe system.   Judith Roth was instructed to call 911 with any severe reactions post vaccine: Marland Kitchen Difficulty breathing  . Swelling of face and throat  . A fast heartbeat  . A bad rash all over body  . Dizziness and weakness   Immunizations Administered    Name Date Dose VIS Date Route   Pfizer COVID-19 Vaccine 09/01/2019 11:28 AM 0.3 mL 06/07/2019 Intramuscular   Manufacturer: [employer], [employer]   Lot: [id number]   NDC: [address]-1

## 2019-10-01 ENCOUNTER — Ambulatory Visit: Payer: BC Managed Care – PPO | Attending: Internal Medicine

## 2019-10-01 DIAGNOSIS — Z23 Encounter for immunization: Secondary | ICD-10-CM

## 2019-10-01 NOTE — Progress Notes (Signed)
   Covid-19 Vaccination Clinic  Name:  Judith Roth    MRN: [id number] DOB: 10-19-1955  10/01/2019  [name] was observed post Covid-19 immunization for 15 minutes without incident. She was provided with Vaccine Information Sheet and instruction to access the V-Safe system.   [name] was instructed to call 911 with any severe reactions post vaccine: Marland Kitchen Difficulty breathing  . Swelling of face and throat  . A fast heartbeat  . A bad rash all over body  . Dizziness and weakness   Immunizations Administered    Name Date Dose VIS Date Route   Pfizer COVID-19 Vaccine 10/01/2019 11:41 AM 0.3 mL 06/07/2019 Intramuscular   Manufacturer: [employer], [employer]   Lot: [id number]   NDC: [address]-6

## 2019-10-10 DIAGNOSIS — K649 Unspecified hemorrhoids: Secondary | ICD-10-CM | POA: Diagnosis not present

## 2019-10-10 DIAGNOSIS — M85852 Other specified disorders of bone density and structure, left thigh: Secondary | ICD-10-CM | POA: Diagnosis not present

## 2019-10-10 DIAGNOSIS — F419 Anxiety disorder, unspecified: Secondary | ICD-10-CM | POA: Diagnosis not present

## 2019-10-10 DIAGNOSIS — F3342 Major depressive disorder, recurrent, in full remission: Secondary | ICD-10-CM | POA: Diagnosis not present

## 2019-10-10 DIAGNOSIS — Z0000 Encounter for general adult medical examination without abnormal findings: Secondary | ICD-10-CM | POA: Diagnosis not present

## 2019-10-10 DIAGNOSIS — Z131 Encounter for screening for diabetes mellitus: Secondary | ICD-10-CM | POA: Diagnosis not present

## 2019-10-10 DIAGNOSIS — N952 Postmenopausal atrophic vaginitis: Secondary | ICD-10-CM | POA: Diagnosis not present

## 2019-12-02 ENCOUNTER — Other Ambulatory Visit: Payer: Self-pay | Admitting: Family Medicine

## 2019-12-02 DIAGNOSIS — Z1231 Encounter for screening mammogram for malignant neoplasm of breast: Secondary | ICD-10-CM

## 2019-12-17 ENCOUNTER — Ambulatory Visit: Payer: BC Managed Care – PPO

## 2019-12-20 ENCOUNTER — Other Ambulatory Visit: Payer: Self-pay

## 2019-12-20 ENCOUNTER — Ambulatory Visit
Admission: RE | Admit: 2019-12-20 | Discharge: 2019-12-20 | Disposition: A | Discharge status: Home / Self Care | Payer: BC Managed Care – PPO | Source: Ambulatory Visit | Attending: Family Medicine | Admitting: Family Medicine

## 2019-12-20 ENCOUNTER — Ambulatory Visit: Payer: BC Managed Care – PPO

## 2019-12-20 DIAGNOSIS — Z1231 Encounter for screening mammogram for malignant neoplasm of breast: Secondary | ICD-10-CM | POA: Diagnosis not present

## 2020-01-14 DIAGNOSIS — H43813 Vitreous degeneration, bilateral: Secondary | ICD-10-CM | POA: Diagnosis not present

## 2020-06-30 DIAGNOSIS — L820 Inflamed seborrheic keratosis: Secondary | ICD-10-CM | POA: Diagnosis not present

## 2020-06-30 DIAGNOSIS — L821 Other seborrheic keratosis: Secondary | ICD-10-CM | POA: Diagnosis not present

## 2020-06-30 DIAGNOSIS — L720 Epidermal cyst: Secondary | ICD-10-CM | POA: Diagnosis not present

## 2020-06-30 DIAGNOSIS — D2261 Melanocytic nevi of right upper limb, including shoulder: Secondary | ICD-10-CM | POA: Diagnosis not present

## 2020-06-30 DIAGNOSIS — D2262 Melanocytic nevi of left upper limb, including shoulder: Secondary | ICD-10-CM | POA: Diagnosis not present

## 2020-08-24 DIAGNOSIS — M9901 Segmental and somatic dysfunction of cervical region: Secondary | ICD-10-CM | POA: Diagnosis not present

## 2020-08-24 DIAGNOSIS — M5412 Radiculopathy, cervical region: Secondary | ICD-10-CM | POA: Diagnosis not present

## 2020-08-27 DIAGNOSIS — M5412 Radiculopathy, cervical region: Secondary | ICD-10-CM | POA: Diagnosis not present

## 2020-08-27 DIAGNOSIS — M9901 Segmental and somatic dysfunction of cervical region: Secondary | ICD-10-CM | POA: Diagnosis not present

## 2020-08-31 DIAGNOSIS — M5412 Radiculopathy, cervical region: Secondary | ICD-10-CM | POA: Diagnosis not present

## 2020-08-31 DIAGNOSIS — M9901 Segmental and somatic dysfunction of cervical region: Secondary | ICD-10-CM | POA: Diagnosis not present

## 2020-09-07 DIAGNOSIS — M5412 Radiculopathy, cervical region: Secondary | ICD-10-CM | POA: Diagnosis not present

## 2020-09-07 DIAGNOSIS — M9901 Segmental and somatic dysfunction of cervical region: Secondary | ICD-10-CM | POA: Diagnosis not present

## 2020-09-22 DIAGNOSIS — M5412 Radiculopathy, cervical region: Secondary | ICD-10-CM | POA: Diagnosis not present

## 2020-09-22 DIAGNOSIS — M9901 Segmental and somatic dysfunction of cervical region: Secondary | ICD-10-CM | POA: Diagnosis not present

## 2020-10-14 DIAGNOSIS — M5412 Radiculopathy, cervical region: Secondary | ICD-10-CM | POA: Diagnosis not present

## 2020-10-14 DIAGNOSIS — M9901 Segmental and somatic dysfunction of cervical region: Secondary | ICD-10-CM | POA: Diagnosis not present

## 2020-10-22 DIAGNOSIS — M5412 Radiculopathy, cervical region: Secondary | ICD-10-CM | POA: Diagnosis not present

## 2020-10-22 DIAGNOSIS — M9901 Segmental and somatic dysfunction of cervical region: Secondary | ICD-10-CM | POA: Diagnosis not present

## 2020-11-03 DIAGNOSIS — R197 Diarrhea, unspecified: Secondary | ICD-10-CM | POA: Diagnosis not present

## 2020-11-03 DIAGNOSIS — Z0000 Encounter for general adult medical examination without abnormal findings: Secondary | ICD-10-CM | POA: Diagnosis not present

## 2020-11-03 DIAGNOSIS — E559 Vitamin D deficiency, unspecified: Secondary | ICD-10-CM | POA: Diagnosis not present

## 2020-11-03 DIAGNOSIS — F3342 Major depressive disorder, recurrent, in full remission: Secondary | ICD-10-CM | POA: Diagnosis not present

## 2020-11-03 DIAGNOSIS — Z131 Encounter for screening for diabetes mellitus: Secondary | ICD-10-CM | POA: Diagnosis not present

## 2020-11-03 DIAGNOSIS — F419 Anxiety disorder, unspecified: Secondary | ICD-10-CM | POA: Diagnosis not present

## 2020-11-03 DIAGNOSIS — Z136 Encounter for screening for cardiovascular disorders: Secondary | ICD-10-CM | POA: Diagnosis not present

## 2020-11-05 ENCOUNTER — Other Ambulatory Visit: Payer: Self-pay | Admitting: Family Medicine

## 2020-11-05 DIAGNOSIS — M8580 Other specified disorders of bone density and structure, unspecified site: Secondary | ICD-10-CM

## 2020-11-17 ENCOUNTER — Other Ambulatory Visit: Payer: Self-pay | Admitting: Family Medicine

## 2020-11-17 DIAGNOSIS — M8580 Other specified disorders of bone density and structure, unspecified site: Secondary | ICD-10-CM

## 2021-02-17 DIAGNOSIS — L980 Pyogenic granuloma: Secondary | ICD-10-CM | POA: Diagnosis not present

## 2021-02-25 DIAGNOSIS — L7211 Pilar cyst: Secondary | ICD-10-CM | POA: Diagnosis not present

## 2021-05-04 ENCOUNTER — Other Ambulatory Visit: Payer: Self-pay

## 2021-05-04 ENCOUNTER — Ambulatory Visit
Admission: RE | Admit: 2021-05-04 | Discharge: 2021-05-04 | Disposition: A | Discharge status: Home / Self Care | Payer: BC Managed Care – PPO | Source: Ambulatory Visit | Attending: Family Medicine | Admitting: Family Medicine

## 2021-05-04 DIAGNOSIS — M8580 Other specified disorders of bone density and structure, unspecified site: Secondary | ICD-10-CM

## 2021-05-04 DIAGNOSIS — Z780 Asymptomatic menopausal state: Secondary | ICD-10-CM | POA: Diagnosis not present

## 2021-05-04 DIAGNOSIS — Z1231 Encounter for screening mammogram for malignant neoplasm of breast: Secondary | ICD-10-CM | POA: Diagnosis not present

## 2021-05-04 DIAGNOSIS — M8588 Other specified disorders of bone density and structure, other site: Secondary | ICD-10-CM | POA: Diagnosis not present

## 2021-05-14 DIAGNOSIS — J0100 Acute maxillary sinusitis, unspecified: Secondary | ICD-10-CM | POA: Diagnosis not present

## 2021-05-14 DIAGNOSIS — J029 Acute pharyngitis, unspecified: Secondary | ICD-10-CM | POA: Diagnosis not present

## 2021-05-14 DIAGNOSIS — J069 Acute upper respiratory infection, unspecified: Secondary | ICD-10-CM | POA: Diagnosis not present

## 2021-05-14 DIAGNOSIS — R059 Cough, unspecified: Secondary | ICD-10-CM | POA: Diagnosis not present

## 2021-05-27 DIAGNOSIS — R300 Dysuria: Secondary | ICD-10-CM | POA: Diagnosis not present

## 2021-07-19 DIAGNOSIS — R300 Dysuria: Secondary | ICD-10-CM | POA: Diagnosis not present

## 2021-10-13 ENCOUNTER — Ambulatory Visit (INDEPENDENT_AMBULATORY_CARE_PROVIDER_SITE_OTHER): Payer: BC Managed Care – PPO

## 2021-10-13 ENCOUNTER — Ambulatory Visit (INDEPENDENT_AMBULATORY_CARE_PROVIDER_SITE_OTHER): Payer: BC Managed Care – PPO | Admitting: Podiatry

## 2021-10-13 DIAGNOSIS — M7751 Other enthesopathy of right foot: Secondary | ICD-10-CM

## 2021-10-13 MED ORDER — METHYLPREDNISOLONE 4 MG PO TBPK: ORAL_TABLET | ORAL | 0 refills | Status: AC

## 2021-10-13 MED ORDER — MELOXICAM 15 MG PO TABS: 15.0000 mg | ORAL_TABLET | Freq: Every day | ORAL | 1 refills | Status: DC

## 2021-10-13 MED ORDER — BETAMETHASONE SOD PHOS & ACET 6 (3-3) MG/ML IJ SUSP
3.0000 mg | Freq: Once | INTRAMUSCULAR | Status: AC
  Administered 2021-10-13: 3 mg via INTRA_ARTICULAR

## 2021-10-13 NOTE — Progress Notes (Signed)
? ?  Subjective: 66 y.o. female presents today as a new patient for evaluation of pain and tenderness along the medial aspect of the right foot.  Patient states that she does have a history of bunions that has been ongoing for several years.  They have mostly been asymptomatic however over the past months she has experienced pain and tenderness to the medial aspect of the right forefoot.  She presents for further treatment and evaluation.  Currently she has not done anything for treatment.  She denies a history of injury ? ? ?Past Medical History:  ?Diagnosis Date  ? Depression   ? Family history of breast cancer   ? Family history of kidney cancer   ? Family history of melanoma   ? Family history of prostate cancer   ? ?Past Surgical History:  ?Procedure Laterality Date  ? ABDOMINAL HYSTERECTOMY    ? DUB/fibroids  ? BLADDER SUSPENSION    ? Shinita Mac  ? SHOULDER SURGERY    ? TONSILLECTOMY    ? TUBAL LIGATION    ? ?No Known Allergies ? ? ? ?Objective: ?Physical Exam ?General: The patient is alert and oriented x3 in no acute distress. ? ?Dermatology: Skin is cool, dry and supple bilateral lower extremities. Negative for open lesions or macerations. ? ?Vascular: Palpable pedal pulses bilaterally. No edema or erythema noted. Capillary refill within normal limits. ? ?Neurological: Epicritic and protective threshold grossly intact bilaterally.  ? ?Musculoskeletal Exam: Clinical evidence of bunion deformity noted to the respective foot. There is moderate pain on palpation range of motion of the first MPJ. Lateral deviation of the hallux noted consistent with hallux abductovalgus.  There is some tenderness to palpation along the medial aspect of the right foot along the first metatarsal ? ?Radiographic Exam: Increased intermetatarsal angle greater than 15? with a hallux abductus angle greater than 30? noted on AP view. Moderate degenerative changes noted within the first MPJ. ? ?Assessment: ?1. HAV w/ bunion deformity  bilateral ?2.  Tendinitis right foot ? ? ?Plan of Care:  ?1. Patient was evaluated. X-Rays reviewed. ?2.  I explained to the patient that she has had the bunion deformities for several years with no symptoms.  I would like to pursue some conservative treatment to see if we can get the foot feeling better without pursuing surgery at this time ?3.  Injection of 0.5 cc Celestone Soluspan injected along the dorsal medial aspect of the right forefoot ?4.  Prescription for Medrol Dosepak ?5.  Prescription for meloxicam 15 mg daily ?6.  Recommend good supportive shoes that do not irritate the area ?7.  Return to clinic in 4 weeks ? ? ?Felecia Shelling, DPM ?[facility] ? ?Dr. Felecia Shelling, DPM  ?  ?[address]                                        ?[place], [place] [address]                ?Office [phone]  ?Fax [phone] ? ? ? ? ? ?

## 2021-10-15 DIAGNOSIS — D225 Melanocytic nevi of trunk: Secondary | ICD-10-CM | POA: Diagnosis not present

## 2021-10-15 DIAGNOSIS — D2262 Melanocytic nevi of left upper limb, including shoulder: Secondary | ICD-10-CM | POA: Diagnosis not present

## 2021-10-15 DIAGNOSIS — D2261 Melanocytic nevi of right upper limb, including shoulder: Secondary | ICD-10-CM | POA: Diagnosis not present

## 2021-10-15 DIAGNOSIS — D1801 Hemangioma of skin and subcutaneous tissue: Secondary | ICD-10-CM | POA: Diagnosis not present

## 2021-11-01 ENCOUNTER — Ambulatory Visit (INDEPENDENT_AMBULATORY_CARE_PROVIDER_SITE_OTHER): Payer: BC Managed Care – PPO | Admitting: Podiatry

## 2021-11-01 ENCOUNTER — Encounter: Payer: Self-pay | Admitting: Podiatry

## 2021-11-01 DIAGNOSIS — M7751 Other enthesopathy of right foot: Secondary | ICD-10-CM

## 2021-11-01 NOTE — Progress Notes (Signed)
? ?  Subjective: 66 y.o. female presents today for follow-up evaluation of tenderness to the medial column of the right foot.  Patient states that she is doing significantly better.  She no longer has any pain or tenderness associated to the foot.  She does experience some numbness sensation that extends up into her leg and thigh.  She does admit to having history of back pathology. ? ? ?Past Medical History:  ?Diagnosis Date  ? Depression   ? Family history of breast cancer   ? Family history of kidney cancer   ? Family history of melanoma   ? Family history of prostate cancer   ? ?Past Surgical History:  ?Procedure Laterality Date  ? ABDOMINAL HYSTERECTOMY    ? DUB/fibroids  ? BLADDER SUSPENSION    ? Kenden Brandt  ? SHOULDER SURGERY    ? TONSILLECTOMY    ? TUBAL LIGATION    ? ?No Known Allergies ? ? ? ?Objective: ?Physical Exam ?General: The patient is alert and oriented x3 in no acute distress. ? ?Dermatology: Skin is cool, dry and supple bilateral lower extremities. Negative for open lesions or macerations. ? ?Vascular: Palpable pedal pulses bilaterally. No edema or erythema noted. Capillary refill within normal limits. ? ?Neurological: Epicritic and protective threshold grossly intact bilaterally.  ? ?Musculoskeletal Exam: Today there is no pain on palpation throughout the medial column of the right foot. ? ?Assessment: ?1. HAV w/ bunion deformity bilateral ?2.  Tendinitis/possible neuritis right foot; resolved ? ? ?Plan of Care:  ?1. Patient was evaluated. X-Rays reviewed. ?2.  Overall the patient is doing significantly better ?3.  Continue meloxicam 15 mg daily as needed ?4.  In order to address the neuropathy and numb/tingling sensation extending up her leg and thigh, I recommend follow-up with neuro spine surgeon given the history of lumbar radiculopathy and lower back pathology ?5.  Continue wearing good supportive shoes and sneakers ?6.  Return to clinic as needed.  If the patient continues to have increased  nocturnal leg pain and cramping I do recommend gabapentin 100 mg nightly for initial treatment.  She will call in the office if she has persistent symptoms ? ?Felecia Shelling, DPM ?[facility] ? ?Dr. Felecia Shelling, DPM  ?  ?[address]                                        ?[place], [place] [address]                ?Office [phone]  ?Fax [phone] ? ? ? ? ? ?

## 2021-11-03 ENCOUNTER — Ambulatory Visit: Payer: Medicare Other | Admitting: Podiatry

## 2021-11-08 DIAGNOSIS — M85852 Other specified disorders of bone density and structure, left thigh: Secondary | ICD-10-CM | POA: Diagnosis not present

## 2021-11-08 DIAGNOSIS — Z131 Encounter for screening for diabetes mellitus: Secondary | ICD-10-CM | POA: Diagnosis not present

## 2021-11-12 DIAGNOSIS — E663 Overweight: Secondary | ICD-10-CM | POA: Diagnosis not present

## 2021-11-12 DIAGNOSIS — Z6828 Body mass index (BMI) 28.0-28.9, adult: Secondary | ICD-10-CM | POA: Diagnosis not present

## 2021-11-12 DIAGNOSIS — F3342 Major depressive disorder, recurrent, in full remission: Secondary | ICD-10-CM | POA: Diagnosis not present

## 2021-11-12 DIAGNOSIS — Z0000 Encounter for general adult medical examination without abnormal findings: Secondary | ICD-10-CM | POA: Diagnosis not present

## 2021-11-12 DIAGNOSIS — Z23 Encounter for immunization: Secondary | ICD-10-CM | POA: Diagnosis not present

## 2021-11-14 ENCOUNTER — Emergency Department (HOSPITAL_BASED_OUTPATIENT_CLINIC_OR_DEPARTMENT_OTHER)
Admission: EM | Admit: 2021-11-14 | Discharge: 2021-11-14 | Disposition: A | Discharge status: Home / Self Care | Payer: BC Managed Care – PPO | Attending: Emergency Medicine | Admitting: Emergency Medicine

## 2021-11-14 ENCOUNTER — Encounter (HOSPITAL_BASED_OUTPATIENT_CLINIC_OR_DEPARTMENT_OTHER): Payer: Self-pay | Admitting: Emergency Medicine

## 2021-11-14 ENCOUNTER — Other Ambulatory Visit: Payer: Self-pay

## 2021-11-14 ENCOUNTER — Emergency Department (HOSPITAL_BASED_OUTPATIENT_CLINIC_OR_DEPARTMENT_OTHER): Payer: BC Managed Care – PPO

## 2021-11-14 DIAGNOSIS — S61223A Laceration with foreign body of left middle finger without damage to nail, initial encounter: Secondary | ICD-10-CM | POA: Diagnosis not present

## 2021-11-14 DIAGNOSIS — S81812A Laceration without foreign body, left lower leg, initial encounter: Secondary | ICD-10-CM | POA: Insufficient documentation

## 2021-11-14 DIAGNOSIS — Y9301 Activity, walking, marching and hiking: Secondary | ICD-10-CM | POA: Insufficient documentation

## 2021-11-14 DIAGNOSIS — Z7982 Long term (current) use of aspirin: Secondary | ICD-10-CM | POA: Diagnosis not present

## 2021-11-14 DIAGNOSIS — W010XXA Fall on same level from slipping, tripping and stumbling without subsequent striking against object, initial encounter: Secondary | ICD-10-CM | POA: Diagnosis not present

## 2021-11-14 DIAGNOSIS — Z23 Encounter for immunization: Secondary | ICD-10-CM | POA: Insufficient documentation

## 2021-11-14 DIAGNOSIS — S61211A Laceration without foreign body of left index finger without damage to nail, initial encounter: Secondary | ICD-10-CM | POA: Diagnosis not present

## 2021-11-14 DIAGNOSIS — S8992XA Unspecified injury of left lower leg, initial encounter: Secondary | ICD-10-CM | POA: Diagnosis not present

## 2021-11-14 DIAGNOSIS — W19XXXA Unspecified fall, initial encounter: Secondary | ICD-10-CM

## 2021-11-14 MED ORDER — BACITRACIN ZINC 500 UNIT/GM EX OINT
TOPICAL_OINTMENT | Freq: Once | CUTANEOUS | Status: AC
  Administered 2021-11-14: 31.5 via TOPICAL
  Filled 2021-11-14: qty 28.35

## 2021-11-14 MED ORDER — TETANUS-DIPHTH-ACELL PERTUSSIS 5-2.5-18.5 LF-MCG/0.5 IM SUSY
0.5000 mL | PREFILLED_SYRINGE | Freq: Once | INTRAMUSCULAR | Status: AC
  Administered 2021-11-14: 0.5 mL via INTRAMUSCULAR
  Filled 2021-11-14: qty 0.5

## 2021-11-14 MED ORDER — LIDOCAINE HCL (PF) 1 % IJ SOLN
20.0000 mL | Freq: Once | INTRAMUSCULAR | Status: AC
  Administered 2021-11-14: 20 mL
  Filled 2021-11-14: qty 20

## 2021-11-14 NOTE — ED Provider Notes (Signed)
[facility] EMERGENCY DEPT Provider Note   CSN: [id number] Arrival date & time: 11/14/21  1513     History  Chief Complaint  Patient presents with   Judith Roth is a 66 y.o. female who presents to the emergency department complaining of a fall onset prior to arrival.  Patient notes that she was carrying a large pottery pot walking out of the door when her foot slipped on the door jam and she landed on her lower legs.  She notes that the pottery broke at this time.  Unsure of her tetanus status.  Patient has laceration to the lateral aspect of the left leg and laceration noted to distal portion of the left middle finger.  She is not on any blood thinners at this time.  Denies any head, LOC, vomiting, dizziness, lightheadedness.   The history is provided by the patient. No language interpreter was used.      Home Medications Prior to Admission medications   Medication Sig Start Date End Date Taking? Authorizing Provider  aspirin EC 81 MG tablet SMARTSIG:1 By Mouth 10/15/21   [provider]  calcium carbonate 200 MG capsule Take 250 mg by mouth 2 (two) times daily with a meal.    [provider]  cholecalciferol (VITAMIN D) 1000 UNITS tablet Take 5 Units by mouth daily.    [provider]  ciprofloxacin (CIPRO) 500 MG tablet Take 1 tablet (500 mg total) by mouth 2 (two) times daily. Patient not taking: Reported on 09/16/2014 06/29/13   Tonye Pearson, MD  cyclobenzaprine (FLEXERIL) 10 MG tablet Take 1 tablet (10 mg total) by mouth 3 (three) times daily as needed for muscle spasms. 09/16/14   Andrena Mews, DO  diclofenac (VOLTAREN) 75 MG EC tablet Take 1 tablet (75 mg total) by mouth 2 (two) times daily. 09/16/14   Andrena Mews, DO  FLUoxetine (PROZAC) 10 MG capsule Take by mouth. 10/15/21   [provider]  FLUoxetine (PROZAC) 20 MG capsule Take 20 mg by mouth 2 (two) times daily.    [provider]   meloxicam (MOBIC) 15 MG tablet Take 1 tablet (15 mg total) by mouth daily. 10/13/21   Felecia Shelling, DPM  methylPREDNISolone (MEDROL DOSEPAK) 4 MG TBPK tablet 6 day dose pack - take as directed 10/13/21   Felecia Shelling, DPM      Allergies    Patient has no known allergies.    Review of Systems   Review of Systems  Gastrointestinal:  Negative for nausea and vomiting.  Musculoskeletal:  Positive for arthralgias. Negative for joint swelling.  Skin:  Positive for wound. Negative for color change.  Neurological:  Negative for syncope.  All other systems reviewed and are negative.  Physical Exam Updated Vital Signs BP 120/67 (BP Location: Right Arm)   Pulse 62   Temp 98.7 F (37.1 C) (Oral)   Resp 16   Ht 5\' 5"  (1.651 m)   Wt 78.5 kg   SpO2 100%   BMI 28.79 kg/m  Physical Exam Vitals and nursing note reviewed.  Constitutional:      General: She is not in acute distress.    Appearance: Normal appearance. She is not ill-appearing.  HENT:     Head: Normocephalic and atraumatic.     Right Ear: External ear normal.     Left Ear: External ear normal.  Eyes:     General: No scleral icterus. Cardiovascular:  Rate and Rhythm: Normal rate.  Pulmonary:     Effort: Pulmonary effort is normal.  Musculoskeletal:        General: Normal range of motion.     Cervical back: Normal range of motion and neck supple.     Comments: No tenderness to palpation noted to left knee.  Able to flex and extend left knee without difficulty.  Pedal pulses intact bilaterally.  Skin:    General: Skin is warm and dry.     Capillary Refill: Capillary refill takes less than 2 seconds.     Findings: Laceration present.     Comments: 3 cm laceration noted to lateral aspect of left lower leg.  Bleeding controlled.  Mild tenderness to palpation noted to the area.  Neurological:     Mental Status: She is alert.    ED Results / Procedures / Treatments   Labs (all labs ordered are listed, but only  abnormal results are displayed) Labs Reviewed - No data to display  EKG None  Radiology DG Tibia/Fibula Left  Result Date: 11/14/2021 CLINICAL DATA:  Fall, left lateral leg laceration. EXAM: LEFT TIBIA AND FIBULA - 2 VIEW COMPARISON:  None Available. FINDINGS: No acute bony abnormality. Specifically, no fracture, subluxation, or dislocation. Mild degenerative changes in the knee and ankle. No radiopaque foreign body. IMPRESSION: No acute bony abnormality. Electronically Signed   By: Charlett NoseKevin  Dover M.D.   On: 11/14/2021 20:15   DG Finger Middle Left  Result Date: 11/14/2021 CLINICAL DATA:  Fall, finger laceration EXAM: LEFT MIDDLE FINGER 2+V COMPARISON:  None Available. FINDINGS: No acute bony abnormality. Specifically, no fracture, subluxation, or dislocation. Radiopaque densities posterior to the middle finger DIP joint on the lateral view, likely small radiopaque foreign bodies. IMPRESSION: Small radiopaque foreign bodies posterior to the DIP joint. No acute bony abnormality. Electronically Signed   By: Charlett NoseKevin  Dover M.D.   On: 11/14/2021 20:16    Procedures .Marland Kitchen.Laceration Repair  Date/Time: 11/14/2021 9:48 PM Performed by: Chestine SporeBlue, Marquita Lias A, PA-C Authorized by: Karenann CaiBlue, Veleda Mun A, PA-C   Consent:    Consent obtained:  Verbal   Consent given by:  Patient   Risks discussed:  Infection, pain and need for additional repair Universal protocol:    Patient identity confirmed:  Verbally with patient and hospital-assigned identification number Anesthesia:    Anesthesia method:  Local infiltration   Local anesthetic:  Lidocaine 1% w/o epi (6 ml) Laceration details:    Location:  Leg   Leg location:  L lower leg   Length (cm):  3 Pre-procedure details:    Preparation:  Patient was prepped and draped in usual sterile fashion and imaging obtained to evaluate for foreign bodies Exploration:    Hemostasis achieved with:  Direct pressure   Imaging obtained: x-ray     Imaging outcome: foreign body not  noted     Wound exploration: entire depth of wound visualized   Treatment:    Area cleansed with:  Saline   Amount of cleaning:  Standard   Irrigation solution:  Sterile saline   Irrigation method:  Syringe Skin repair:    Repair method:  Sutures   Suture size:  4-0   Suture material:  Prolene   Suture technique:  Simple interrupted   Number of sutures:  7 Approximation:    Approximation:  Close Repair type:    Repair type:  Simple Post-procedure details:    Dressing:  Non-adherent dressing and sterile dressing   Procedure completion:  Tolerated well,  no immediate complications .Foreign Body Removal  Date/Time: 11/14/2021 9:48 PM Performed by: Karenann Cai, PA-C Authorized by: Karenann Cai, PA-C  Consent: Verbal consent obtained. Consent given by: patient Patient identity confirmed: verbally with patient and hospital-assigned identification number Intake: Distal left middle phalanx.  Sedation: Patient sedated: no  Complexity: simple 3 objects recovered. Objects recovered: Pieces of pottery Post-procedure assessment: foreign body removed Patient tolerance: patient tolerated the procedure well with no immediate complications     Medications Ordered in ED Medications  lidocaine (PF) (XYLOCAINE) 1 % injection 20 mL (20 mLs Infiltration Given 11/14/21 2050)  Tdap (BOOSTRIX) injection 0.5 mL (0.5 mLs Intramuscular Given 11/14/21 2049)  bacitracin ointment (31.5 application. Topical Given 11/14/21 2133)    ED Course/ Medical Decision Making/ A&P                           Medical Decision Making Amount and/or Complexity of Data Reviewed Radiology: ordered.  Risk OTC drugs. Prescription drug management.   Patient presents with fall and laceration noted prior to arrival.  Patient notes mechanical fall.  Denies any head, LOC, nausea, vomiting.  Unsure of tetanus status, tetanus updated in the ED.  Patient not on anticoagulants at this time.  Vital signs stable,  patient afebrile.  On exam patient with 3 cm laceration noted to the lateral aspect of the left leg.  Bleeding controlled at this time.  Mild tenderness to palpation noted to the area.  Pedal pulses intact bilaterally.  No tenderness to palpation noted to left knee with full flexion and extension.  Superficial laceration noted to distal left middle phalanx.  Foreign bodies noted on exam and removed. Laceration occurred < 12 hours prior to repair. Differential diagnosis includes, fracture, foreign body, dislocation, avulsion.    Imaging: I ordered imaging studies including left tib-fib x-ray, left middle finger x-ray I independently visualized and interpreted imaging which showed: Left tib-fib without acute fracture or dislocation. Left middle finger x-ray with  Small radiopaque foreign bodies posterior to the DIP joint. No acute  bony abnormality.   I agree with the radiologist interpretation  Medications:  I ordered medication including tetanus vaccine for prophylaxis I have reviewed the patients home medicines and have made adjustments as needed    Disposition: Presentation suspicious for laceration in the setting of the fall.  Doubt acute intracranial abnormality.  Doubt fracture, dislocation. Doubt fracture, dislocation, or foreign body at this time. Tetanus updated in the ED. Wound thoroughly irrigated, no foreign bodies noted. Laceration repaired in the ED today. After consideration of the diagnostic results and the patients response to treatment, I feel that the patient would benefit from Discharge home. Discussed laceration care with pt and answered questions. Pt to follow up for suture/staple removal in 7-10 days and wound check sooner should there be signs of dehiscence or infection. Pt is hemodynamically stable with no complaints prior to discharge. Supportive care measures and strict return precautions discussed with patient at bedside. Pt acknowledges and verbalizes understanding. Pt  appears safe for discharge. Follow up as indicated in discharge paperwork.    This chart was dictated using voice recognition software, Dragon. Despite the best efforts of this provider to proofread and correct errors, errors may still occur which can change documentation meaning.  Final Clinical Impression(s) / ED Diagnoses Final diagnoses:  Fall, initial encounter  Laceration of left lower extremity, initial encounter    Rx / DC Orders ED Discharge Orders  None         Waylynn Benefiel A, PA-C 11/14/21 2154    Pricilla Loveless, MD 11/20/21 1616

## 2021-11-14 NOTE — ED Notes (Signed)
Wound care provided for pt. Wound was irrigated, applied bacitracin, and wrapped with curlex and coban.

## 2021-11-14 NOTE — Discharge Instructions (Addendum)
It was a pleasure taking care of you today!   You may return to urgent care or return to the emergency department for suture removal in 7-10 days.  Keep the area clean and dry.  Return to the emergency department if worsening or persistent pain, drainage of wound, increased swelling, or color change to area.  

## 2021-11-14 NOTE — ED Triage Notes (Signed)
Pt was walking into her house carrying pottery and fell, her left lateral shin has laceration and left index finger as well.

## 2021-12-01 ENCOUNTER — Encounter: Payer: Self-pay | Admitting: Genetic Counselor

## 2021-12-08 DIAGNOSIS — M9901 Segmental and somatic dysfunction of cervical region: Secondary | ICD-10-CM | POA: Diagnosis not present

## 2021-12-08 DIAGNOSIS — M5412 Radiculopathy, cervical region: Secondary | ICD-10-CM | POA: Diagnosis not present

## 2021-12-11 ENCOUNTER — Other Ambulatory Visit: Payer: Self-pay | Admitting: Podiatry

## 2021-12-13 NOTE — Telephone Encounter (Signed)
If you look at the chart the Rx for Voltaren was from 2016. - Dr. Logan Bores

## 2021-12-13 NOTE — Telephone Encounter (Signed)
Taking duplicate therapy medication(Voltaren), this along with another medication Prozac (possible GI bleed)please advise

## 2021-12-15 DIAGNOSIS — R300 Dysuria: Secondary | ICD-10-CM | POA: Diagnosis not present

## 2021-12-15 DIAGNOSIS — M5412 Radiculopathy, cervical region: Secondary | ICD-10-CM | POA: Diagnosis not present

## 2021-12-15 DIAGNOSIS — M9901 Segmental and somatic dysfunction of cervical region: Secondary | ICD-10-CM | POA: Diagnosis not present

## 2021-12-15 DIAGNOSIS — N390 Urinary tract infection, site not specified: Secondary | ICD-10-CM | POA: Diagnosis not present

## 2021-12-20 ENCOUNTER — Encounter: Payer: Self-pay | Admitting: Genetic Counselor

## 2021-12-20 NOTE — Progress Notes (Signed)
UPDATE: RET p.T1038I VUS has been reclassified to Likely benign.  The amended report date is Nov 16, 2021.

## 2021-12-29 DIAGNOSIS — M5412 Radiculopathy, cervical region: Secondary | ICD-10-CM | POA: Diagnosis not present

## 2021-12-29 DIAGNOSIS — M9901 Segmental and somatic dysfunction of cervical region: Secondary | ICD-10-CM | POA: Diagnosis not present

## 2022-01-31 DIAGNOSIS — M9901 Segmental and somatic dysfunction of cervical region: Secondary | ICD-10-CM | POA: Diagnosis not present

## 2022-01-31 DIAGNOSIS — M5412 Radiculopathy, cervical region: Secondary | ICD-10-CM | POA: Diagnosis not present

## 2022-02-11 ENCOUNTER — Other Ambulatory Visit: Payer: Self-pay | Admitting: Podiatry

## 2022-02-11 NOTE — Telephone Encounter (Signed)
Please advise 

## 2022-02-22 DIAGNOSIS — M9901 Segmental and somatic dysfunction of cervical region: Secondary | ICD-10-CM | POA: Diagnosis not present

## 2022-02-22 DIAGNOSIS — M5412 Radiculopathy, cervical region: Secondary | ICD-10-CM | POA: Diagnosis not present

## 2022-03-01 DIAGNOSIS — L820 Inflamed seborrheic keratosis: Secondary | ICD-10-CM | POA: Diagnosis not present

## 2022-03-15 DIAGNOSIS — M5412 Radiculopathy, cervical region: Secondary | ICD-10-CM | POA: Diagnosis not present

## 2022-03-15 DIAGNOSIS — M9901 Segmental and somatic dysfunction of cervical region: Secondary | ICD-10-CM | POA: Diagnosis not present

## 2022-03-28 DIAGNOSIS — R300 Dysuria: Secondary | ICD-10-CM | POA: Diagnosis not present

## 2022-04-01 ENCOUNTER — Other Ambulatory Visit: Payer: Self-pay | Admitting: Family Medicine

## 2022-04-01 DIAGNOSIS — Z1231 Encounter for screening mammogram for malignant neoplasm of breast: Secondary | ICD-10-CM

## 2022-04-05 DIAGNOSIS — M9901 Segmental and somatic dysfunction of cervical region: Secondary | ICD-10-CM | POA: Diagnosis not present

## 2022-04-05 DIAGNOSIS — M5412 Radiculopathy, cervical region: Secondary | ICD-10-CM | POA: Diagnosis not present

## 2022-04-12 ENCOUNTER — Other Ambulatory Visit: Payer: Self-pay | Admitting: Podiatry

## 2022-04-14 NOTE — Telephone Encounter (Signed)
Please advise 

## 2022-04-19 DIAGNOSIS — L820 Inflamed seborrheic keratosis: Secondary | ICD-10-CM | POA: Diagnosis not present

## 2022-04-21 DIAGNOSIS — J0110 Acute frontal sinusitis, unspecified: Secondary | ICD-10-CM | POA: Diagnosis not present

## 2022-04-21 DIAGNOSIS — R300 Dysuria: Secondary | ICD-10-CM | POA: Diagnosis not present

## 2022-05-05 ENCOUNTER — Ambulatory Visit
Admission: RE | Admit: 2022-05-05 | Discharge: 2022-05-05 | Disposition: A | Discharge status: Home / Self Care | Payer: BC Managed Care – PPO | Source: Ambulatory Visit | Attending: Family Medicine | Admitting: Family Medicine

## 2022-05-05 DIAGNOSIS — Z1231 Encounter for screening mammogram for malignant neoplasm of breast: Secondary | ICD-10-CM | POA: Diagnosis not present

## 2022-06-13 ENCOUNTER — Other Ambulatory Visit: Payer: Self-pay | Admitting: Podiatry

## 2022-06-28 DIAGNOSIS — M1711 Unilateral primary osteoarthritis, right knee: Secondary | ICD-10-CM | POA: Diagnosis not present

## 2022-08-04 DIAGNOSIS — M9901 Segmental and somatic dysfunction of cervical region: Secondary | ICD-10-CM | POA: Diagnosis not present

## 2022-08-04 DIAGNOSIS — M5412 Radiculopathy, cervical region: Secondary | ICD-10-CM | POA: Diagnosis not present

## 2022-09-19 DIAGNOSIS — Z6829 Body mass index (BMI) 29.0-29.9, adult: Secondary | ICD-10-CM | POA: Diagnosis not present

## 2022-09-19 DIAGNOSIS — R300 Dysuria: Secondary | ICD-10-CM | POA: Diagnosis not present

## 2022-11-08 DIAGNOSIS — E559 Vitamin D deficiency, unspecified: Secondary | ICD-10-CM | POA: Diagnosis not present

## 2022-11-08 DIAGNOSIS — M85852 Other specified disorders of bone density and structure, left thigh: Secondary | ICD-10-CM | POA: Diagnosis not present

## 2022-11-16 DIAGNOSIS — F3342 Major depressive disorder, recurrent, in full remission: Secondary | ICD-10-CM | POA: Diagnosis not present

## 2022-11-16 DIAGNOSIS — N390 Urinary tract infection, site not specified: Secondary | ICD-10-CM | POA: Diagnosis not present

## 2022-11-16 DIAGNOSIS — B001 Herpesviral vesicular dermatitis: Secondary | ICD-10-CM | POA: Diagnosis not present

## 2022-11-16 DIAGNOSIS — Z0000 Encounter for general adult medical examination without abnormal findings: Secondary | ICD-10-CM | POA: Diagnosis not present

## 2022-11-16 DIAGNOSIS — M85852 Other specified disorders of bone density and structure, left thigh: Secondary | ICD-10-CM | POA: Diagnosis not present

## 2023-01-16 ENCOUNTER — Encounter: Payer: Self-pay | Admitting: Podiatry

## 2023-01-16 ENCOUNTER — Ambulatory Visit (INDEPENDENT_AMBULATORY_CARE_PROVIDER_SITE_OTHER): Payer: BC Managed Care – PPO | Admitting: Podiatry

## 2023-01-16 ENCOUNTER — Ambulatory Visit (INDEPENDENT_AMBULATORY_CARE_PROVIDER_SITE_OTHER): Payer: BC Managed Care – PPO

## 2023-01-16 DIAGNOSIS — M79672 Pain in left foot: Secondary | ICD-10-CM | POA: Diagnosis not present

## 2023-01-16 DIAGNOSIS — M7752 Other enthesopathy of left foot: Secondary | ICD-10-CM

## 2023-01-16 MED ORDER — DEXAMETHASONE SODIUM PHOSPHATE 120 MG/30ML IJ SOLN: 4.0000 mg | Freq: Once | INTRAMUSCULAR | Status: AC

## 2023-01-16 MED ORDER — MELOXICAM 15 MG PO TABS: 15.0000 mg | ORAL_TABLET | Freq: Every day | ORAL | 1 refills | Status: DC

## 2023-01-16 MED ORDER — TRIAMCINOLONE ACETONIDE 10 MG/ML IJ SUSP: 2.5000 mg | Freq: Once | INTRAMUSCULAR | Status: AC

## 2023-01-16 NOTE — Patient Instructions (Signed)
Stretching and range-of-motion exercises These exercises warm up your muscles and joints. They can help improve the movement and flexibility of your ankle. They may also help to relieve pain and stiffness. Gastrocnemius and soleus stretch, standing This is an exercise in which you stand on a step and use your body weight to stretch your calf muscles. To do this exercise: Stand on the edge of a step on the ball of your left / right foot. The ball of your foot is on the walking surface, right under your toes. Keep your other foot firmly on the same step. Hold on to the wall, a railing, or a chair for balance. Slowly lift your other foot, allowing your body weight to press your left / right heel down over the edge of the step. You should feel a stretch in your left / right calf (gastrocnemius and soleus). Hold this position for __________ seconds. Return both feet to the step. Repeat this exercise with a slight bend in your left / right knee. Repeat __________ times with your left / right knee straight and __________ times with your left / right knee bent. Complete this exercise __________ times a day. Strengthening exercises These exercises build strength and endurance in your foot and ankle. Endurance is the ability to use your muscles for a long time, even after they get tired. Ankle dorsiflexion with band  Secure a rubber exercise band or tube to an object, such as a table leg, that will not move when the band is pulled. Secure the other end of the band around your left / right foot. Sit on the floor. Face the object with your left / right leg extended. The band or tube should be slightly tense when your foot is relaxed. Slowly flex your left / right ankle and toes to bring your foot toward you (dorsiflexion). Hold this position for __________ seconds. Let the band or tube slowly pull your foot back to the starting position. Repeat __________ times. Complete this exercise __________ times a  day. Ankle eversion  Sit on the floor with your legs straight out in front of you. Loop a rubber exercise band or tube around the ball of your left / right foot. The ball of your foot is on the walking surface, right under your toes. Hold the ends of the band in your hands. You can also secure the band to a stable object. The band or tube should be slightly tense when your foot is relaxed. Slowly push your foot outward, away from your other leg (eversion). Hold this position for __________ seconds. Slowly return your foot to the starting position. Repeat __________ times. Complete this exercise __________ times a day. Plantar flexion, standing This exercise is sometimes called a standing heel raise. Stand with your feet shoulder-width apart. Place your hands on a wall or table to steady yourself as needed. Try not to use it for support. Keep your weight spread evenly over the width of your feet while you slowly rise up on your toes (plantar flexion). If told by your health care provider: Shift your weight toward your left / right leg until you feel challenged. Stand on your left / right leg only. Hold this position for __________ seconds. Repeat __________ times. Complete this exercise __________ times a day. Single leg stand  Without shoes, stand near a railing or in a doorway. You may hold on to the railing or doorframe as needed. Stand on your left / right foot. Keep your big toe  down on the floor and try to keep your arch lifted. Do not roll to the outside of your foot. If this exercise is too easy, you can try it with your eyes closed or while standing on a pillow. Hold this position for __________ seconds. Repeat __________ times. Complete this exercise __________ times a day. This information is not intended to replace advice given to you by your health care provider. Make sure you discuss any questions you have with your health care provider. Document Revised: 10/07/2021 Document  Reviewed: 10/07/2021 Elsevier Patient Education  2024 [employer].

## 2023-01-16 NOTE — Progress Notes (Signed)
  Subjective:  Patient ID: Judith Roth, female    DOB: 09-Jul-1955,   MRN: [id number]  Chief Complaint  Patient presents with   Foot Pain    Patient came in today for left foot pain, patient plays pickleball, patient s having pain in the medial side of the foot and bunion area while playing, rate of pain 6 out of 10, X-Rays done today     67 y.o. female presents for concern of left foot pain as above.  . Denies any other pedal complaints. Denies n/v/f/c.   Past Medical History:  Diagnosis Date   Depression    Family history of breast cancer    Family history of kidney cancer    Family history of melanoma    Family history of prostate cancer     Objective:  Physical Exam: Vascular: DP/PT pulses 2/4 bilateral. CFT <3 seconds. Normal hair growth on digits. No edema.  Skin. No lacerations or abrasions bilateral feet.  Musculoskeletal: MMT 5/5 bilateral lower extremities in DF, PF, Inversion and Eversion. Deceased ROM in DF of ankle joint. Tender to the anterior tibial tendon area from the midshaft of the first metatarsal medially and dorsally tracking proximally to the ankle. Pain with DF and PF of the ankle as well as pain with inversion of the foot. Some pain noted around the medial eminence and HAV deformity noted.  Neurological: Sensation intact to light touch.   Assessment:   1. Tendinitis of left foot   2. Left foot pain      Plan:  Patient was evaluated and treated and all questions answered. X-rays reviewed and discussed with patient. No acute fractures or dislocations noted. 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 Degenerative changes noted throughout the midfoot specifically in the second and third TMTJ.  Discussed anterior tibial tendinitis and treatment options at length with patient Discussed stretching exercises and provided handout. Prescription for meloxicam provided Dispensed Tri-Lock ankle brace. Injection provided  procedure below.  Discussed that if the symptoms do not improve can consider PT/MRI. Patient to return in 6 to 8 weeks or sooner if symptoms fail to improve or worsen.   Procedure: Injection Tendon/Ligament Discussed alternatives, risks, complications and verbal consent was obtained.  Location: Left tibialis anterior tendon. Skin Prep: Alcohol. Injectate: 1cc 0.5% marcaine plain, 1 cc dexamethasone 0.5 cc kenalog  Disposition: Patient tolerated procedure well. Injection site dressed with a band-aid.  Post-injection care was discussed and return precautions discussed.     Louann Sjogren, DPM

## 2023-01-18 DIAGNOSIS — M25532 Pain in left wrist: Secondary | ICD-10-CM | POA: Diagnosis not present

## 2023-01-18 DIAGNOSIS — G8918 Other acute postprocedural pain: Secondary | ICD-10-CM | POA: Diagnosis not present

## 2023-01-18 DIAGNOSIS — S52502A Unspecified fracture of the lower end of left radius, initial encounter for closed fracture: Secondary | ICD-10-CM | POA: Diagnosis not present

## 2023-01-23 DIAGNOSIS — M25532 Pain in left wrist: Secondary | ICD-10-CM | POA: Diagnosis not present

## 2023-01-23 DIAGNOSIS — G8918 Other acute postprocedural pain: Secondary | ICD-10-CM | POA: Diagnosis not present

## 2023-01-23 DIAGNOSIS — S52502A Unspecified fracture of the lower end of left radius, initial encounter for closed fracture: Secondary | ICD-10-CM | POA: Diagnosis not present

## 2023-01-24 DIAGNOSIS — S52571A Other intraarticular fracture of lower end of right radius, initial encounter for closed fracture: Secondary | ICD-10-CM | POA: Diagnosis not present

## 2023-01-24 DIAGNOSIS — Y999 Unspecified external cause status: Secondary | ICD-10-CM | POA: Diagnosis not present

## 2023-01-24 DIAGNOSIS — X58XXXA Exposure to other specified factors, initial encounter: Secondary | ICD-10-CM | POA: Diagnosis not present

## 2023-01-24 DIAGNOSIS — S52572A Other intraarticular fracture of lower end of left radius, initial encounter for closed fracture: Secondary | ICD-10-CM | POA: Diagnosis not present

## 2023-02-06 DIAGNOSIS — S52502D Unspecified fracture of the lower end of left radius, subsequent encounter for closed fracture with routine healing: Secondary | ICD-10-CM | POA: Diagnosis not present

## 2023-02-13 DIAGNOSIS — D2261 Melanocytic nevi of right upper limb, including shoulder: Secondary | ICD-10-CM | POA: Diagnosis not present

## 2023-02-13 DIAGNOSIS — D2262 Melanocytic nevi of left upper limb, including shoulder: Secondary | ICD-10-CM | POA: Diagnosis not present

## 2023-02-13 DIAGNOSIS — L821 Other seborrheic keratosis: Secondary | ICD-10-CM | POA: Diagnosis not present

## 2023-02-13 DIAGNOSIS — L718 Other rosacea: Secondary | ICD-10-CM | POA: Diagnosis not present

## 2023-02-14 DIAGNOSIS — M810 Age-related osteoporosis without current pathological fracture: Secondary | ICD-10-CM | POA: Diagnosis not present

## 2023-02-14 DIAGNOSIS — R300 Dysuria: Secondary | ICD-10-CM | POA: Diagnosis not present

## 2023-02-14 DIAGNOSIS — N390 Urinary tract infection, site not specified: Secondary | ICD-10-CM | POA: Diagnosis not present

## 2023-02-14 DIAGNOSIS — M85852 Other specified disorders of bone density and structure, left thigh: Secondary | ICD-10-CM | POA: Diagnosis not present

## 2023-02-20 ENCOUNTER — Other Ambulatory Visit: Payer: Self-pay | Admitting: Pain Medicine

## 2023-02-20 DIAGNOSIS — Z1231 Encounter for screening mammogram for malignant neoplasm of breast: Secondary | ICD-10-CM

## 2023-02-20 DIAGNOSIS — S52502D Unspecified fracture of the lower end of left radius, subsequent encounter for closed fracture with routine healing: Secondary | ICD-10-CM | POA: Diagnosis not present

## 2023-02-20 DIAGNOSIS — M85852 Other specified disorders of bone density and structure, left thigh: Secondary | ICD-10-CM

## 2023-03-01 ENCOUNTER — Ambulatory Visit: Payer: Medicare Other | Admitting: Podiatry

## 2023-03-01 DIAGNOSIS — M25632 Stiffness of left wrist, not elsewhere classified: Secondary | ICD-10-CM | POA: Diagnosis not present

## 2023-03-02 DIAGNOSIS — M810 Age-related osteoporosis without current pathological fracture: Secondary | ICD-10-CM | POA: Diagnosis not present

## 2023-03-02 DIAGNOSIS — R300 Dysuria: Secondary | ICD-10-CM | POA: Diagnosis not present

## 2023-03-02 DIAGNOSIS — M85852 Other specified disorders of bone density and structure, left thigh: Secondary | ICD-10-CM | POA: Diagnosis not present

## 2023-03-02 DIAGNOSIS — N390 Urinary tract infection, site not specified: Secondary | ICD-10-CM | POA: Diagnosis not present

## 2023-03-09 DIAGNOSIS — M25632 Stiffness of left wrist, not elsewhere classified: Secondary | ICD-10-CM | POA: Diagnosis not present

## 2023-03-11 ENCOUNTER — Other Ambulatory Visit: Payer: Self-pay | Admitting: Podiatry

## 2023-03-13 DIAGNOSIS — M85852 Other specified disorders of bone density and structure, left thigh: Secondary | ICD-10-CM | POA: Diagnosis not present

## 2023-03-13 DIAGNOSIS — Z6829 Body mass index (BMI) 29.0-29.9, adult: Secondary | ICD-10-CM | POA: Diagnosis not present

## 2023-03-17 DIAGNOSIS — M25632 Stiffness of left wrist, not elsewhere classified: Secondary | ICD-10-CM | POA: Diagnosis not present

## 2023-03-20 DIAGNOSIS — S52502D Unspecified fracture of the lower end of left radius, subsequent encounter for closed fracture with routine healing: Secondary | ICD-10-CM | POA: Diagnosis not present

## 2023-03-24 DIAGNOSIS — M25632 Stiffness of left wrist, not elsewhere classified: Secondary | ICD-10-CM | POA: Diagnosis not present

## 2023-03-30 DIAGNOSIS — M25632 Stiffness of left wrist, not elsewhere classified: Secondary | ICD-10-CM | POA: Diagnosis not present

## 2023-04-06 DIAGNOSIS — M25632 Stiffness of left wrist, not elsewhere classified: Secondary | ICD-10-CM | POA: Diagnosis not present

## 2023-04-11 DIAGNOSIS — M25632 Stiffness of left wrist, not elsewhere classified: Secondary | ICD-10-CM | POA: Diagnosis not present

## 2023-04-12 DIAGNOSIS — M9903 Segmental and somatic dysfunction of lumbar region: Secondary | ICD-10-CM | POA: Diagnosis not present

## 2023-04-12 DIAGNOSIS — M5432 Sciatica, left side: Secondary | ICD-10-CM | POA: Diagnosis not present

## 2023-04-18 DIAGNOSIS — M25632 Stiffness of left wrist, not elsewhere classified: Secondary | ICD-10-CM | POA: Diagnosis not present

## 2023-04-19 DIAGNOSIS — M5432 Sciatica, left side: Secondary | ICD-10-CM | POA: Diagnosis not present

## 2023-04-19 DIAGNOSIS — M9903 Segmental and somatic dysfunction of lumbar region: Secondary | ICD-10-CM | POA: Diagnosis not present

## 2023-04-20 DIAGNOSIS — M25632 Stiffness of left wrist, not elsewhere classified: Secondary | ICD-10-CM | POA: Diagnosis not present

## 2023-04-24 DIAGNOSIS — M9903 Segmental and somatic dysfunction of lumbar region: Secondary | ICD-10-CM | POA: Diagnosis not present

## 2023-04-24 DIAGNOSIS — M5432 Sciatica, left side: Secondary | ICD-10-CM | POA: Diagnosis not present

## 2023-04-25 DIAGNOSIS — M25632 Stiffness of left wrist, not elsewhere classified: Secondary | ICD-10-CM | POA: Diagnosis not present

## 2023-04-27 DIAGNOSIS — M25632 Stiffness of left wrist, not elsewhere classified: Secondary | ICD-10-CM | POA: Diagnosis not present

## 2023-05-01 DIAGNOSIS — G5602 Carpal tunnel syndrome, left upper limb: Secondary | ICD-10-CM | POA: Diagnosis not present

## 2023-05-01 DIAGNOSIS — S52502A Unspecified fracture of the lower end of left radius, initial encounter for closed fracture: Secondary | ICD-10-CM | POA: Diagnosis not present

## 2023-05-08 ENCOUNTER — Ambulatory Visit
Admission: RE | Admit: 2023-05-08 | Discharge: 2023-05-08 | Disposition: A | Discharge status: Home / Self Care | Payer: Medicare Other | Source: Ambulatory Visit | Attending: Pain Medicine | Admitting: Pain Medicine

## 2023-05-08 DIAGNOSIS — Z1231 Encounter for screening mammogram for malignant neoplasm of breast: Secondary | ICD-10-CM

## 2023-05-15 DIAGNOSIS — H43813 Vitreous degeneration, bilateral: Secondary | ICD-10-CM | POA: Diagnosis not present

## 2023-05-23 ENCOUNTER — Other Ambulatory Visit: Payer: Self-pay | Admitting: Podiatry

## 2023-06-01 DIAGNOSIS — G5602 Carpal tunnel syndrome, left upper limb: Secondary | ICD-10-CM | POA: Diagnosis not present

## 2023-06-08 DIAGNOSIS — S52502A Unspecified fracture of the lower end of left radius, initial encounter for closed fracture: Secondary | ICD-10-CM | POA: Diagnosis not present

## 2023-06-08 DIAGNOSIS — G5602 Carpal tunnel syndrome, left upper limb: Secondary | ICD-10-CM | POA: Diagnosis not present

## 2023-06-15 DIAGNOSIS — G5602 Carpal tunnel syndrome, left upper limb: Secondary | ICD-10-CM | POA: Diagnosis not present

## 2023-07-29 ENCOUNTER — Other Ambulatory Visit: Payer: Self-pay | Admitting: Podiatry

## 2023-08-25 ENCOUNTER — Ambulatory Visit
Admission: RE | Admit: 2023-08-25 | Discharge: 2023-08-25 | Disposition: A | Discharge status: Home / Self Care | Payer: Medicare Other | Source: Ambulatory Visit | Attending: Pain Medicine | Admitting: Pain Medicine

## 2023-08-25 DIAGNOSIS — E2839 Other primary ovarian failure: Secondary | ICD-10-CM | POA: Diagnosis not present

## 2023-08-25 DIAGNOSIS — Z90722 Acquired absence of ovaries, bilateral: Secondary | ICD-10-CM | POA: Diagnosis not present

## 2023-08-25 DIAGNOSIS — N958 Other specified menopausal and perimenopausal disorders: Secondary | ICD-10-CM | POA: Diagnosis not present

## 2023-08-25 DIAGNOSIS — M85852 Other specified disorders of bone density and structure, left thigh: Secondary | ICD-10-CM

## 2023-09-28 DIAGNOSIS — M5432 Sciatica, left side: Secondary | ICD-10-CM | POA: Diagnosis not present

## 2023-09-28 DIAGNOSIS — M9903 Segmental and somatic dysfunction of lumbar region: Secondary | ICD-10-CM | POA: Diagnosis not present

## 2023-10-31 ENCOUNTER — Other Ambulatory Visit: Payer: Self-pay | Admitting: Podiatry

## 2023-11-16 DIAGNOSIS — M85852 Other specified disorders of bone density and structure, left thigh: Secondary | ICD-10-CM | POA: Diagnosis not present

## 2023-11-16 DIAGNOSIS — E559 Vitamin D deficiency, unspecified: Secondary | ICD-10-CM | POA: Diagnosis not present

## 2023-11-22 DIAGNOSIS — Z0000 Encounter for general adult medical examination without abnormal findings: Secondary | ICD-10-CM | POA: Diagnosis not present

## 2023-11-22 DIAGNOSIS — N952 Postmenopausal atrophic vaginitis: Secondary | ICD-10-CM | POA: Diagnosis not present

## 2023-11-22 DIAGNOSIS — B001 Herpesviral vesicular dermatitis: Secondary | ICD-10-CM | POA: Diagnosis not present

## 2023-11-22 DIAGNOSIS — F3342 Major depressive disorder, recurrent, in full remission: Secondary | ICD-10-CM | POA: Diagnosis not present

## 2024-01-11 DIAGNOSIS — L820 Inflamed seborrheic keratosis: Secondary | ICD-10-CM | POA: Diagnosis not present

## 2024-01-15 DIAGNOSIS — M67432 Ganglion, left wrist: Secondary | ICD-10-CM | POA: Diagnosis not present

## 2024-05-06 ENCOUNTER — Other Ambulatory Visit: Payer: Self-pay | Admitting: Family Medicine

## 2024-05-06 DIAGNOSIS — Z1231 Encounter for screening mammogram for malignant neoplasm of breast: Secondary | ICD-10-CM

## 2024-05-08 DIAGNOSIS — L570 Actinic keratosis: Secondary | ICD-10-CM | POA: Diagnosis not present

## 2024-05-15 ENCOUNTER — Ambulatory Visit
Admission: RE | Admit: 2024-05-15 | Discharge: 2024-05-15 | Disposition: A | Source: Ambulatory Visit | Attending: Family Medicine | Admitting: Family Medicine

## 2024-05-15 DIAGNOSIS — Z1231 Encounter for screening mammogram for malignant neoplasm of breast: Secondary | ICD-10-CM | POA: Diagnosis not present
